# Patient Record
Sex: Female | Born: 1965
Health system: Southern US, Community
[De-identification: ages and names within clinical notes are randomized; demographics above are authoritative.]

## PROBLEM LIST (undated history)

## (undated) DIAGNOSIS — K219 Gastro-esophageal reflux disease without esophagitis: Secondary | ICD-10-CM

## (undated) DIAGNOSIS — I10 Essential (primary) hypertension: Secondary | ICD-10-CM

## (undated) DIAGNOSIS — R300 Dysuria: Secondary | ICD-10-CM

## (undated) DIAGNOSIS — M79604 Pain in right leg: Secondary | ICD-10-CM

## (undated) HISTORY — PX: HYSTEROSCOPY: SHX211

## (undated) HISTORY — DX: Dysuria: R30.0

## (undated) HISTORY — DX: Essential (primary) hypertension: I10

## (undated) HISTORY — DX: Pain in right leg: M79.604

## (undated) HISTORY — DX: Gastro-esophageal reflux disease without esophagitis: K21.9

---

## 2003-05-28 ENCOUNTER — Ambulatory Visit (HOSPITAL_COMMUNITY): Admission: RE | Admit: 2003-05-28 | Discharge: 2003-05-28 | Payer: Self-pay | Admitting: Gynecology

## 2003-05-28 ENCOUNTER — Encounter: Payer: Self-pay | Admitting: Gynecology

## 2003-07-27 ENCOUNTER — Observation Stay (HOSPITAL_COMMUNITY): Admission: EM | Admit: 2003-07-27 | Discharge: 2003-07-28 | Payer: Self-pay | Admitting: Gynecology

## 2003-07-27 ENCOUNTER — Ambulatory Visit (HOSPITAL_BASED_OUTPATIENT_CLINIC_OR_DEPARTMENT_OTHER): Admission: RE | Admit: 2003-07-27 | Discharge: 2003-07-27 | Payer: Self-pay | Admitting: Gynecology

## 2013-02-18 ENCOUNTER — Emergency Department (HOSPITAL_BASED_OUTPATIENT_CLINIC_OR_DEPARTMENT_OTHER)
Admission: EM | Admit: 2013-02-18 | Discharge: 2013-02-19 | Disposition: A | Discharge status: Home / Self Care | Payer: Self-pay | Attending: Emergency Medicine | Admitting: Emergency Medicine

## 2013-02-18 ENCOUNTER — Encounter (HOSPITAL_BASED_OUTPATIENT_CLINIC_OR_DEPARTMENT_OTHER): Payer: Self-pay | Admitting: *Deleted

## 2013-02-18 DIAGNOSIS — I10 Essential (primary) hypertension: Secondary | ICD-10-CM | POA: Insufficient documentation

## 2013-02-18 DIAGNOSIS — Y92009 Unspecified place in unspecified non-institutional (private) residence as the place of occurrence of the external cause: Secondary | ICD-10-CM | POA: Insufficient documentation

## 2013-02-18 DIAGNOSIS — Y9389 Activity, other specified: Secondary | ICD-10-CM | POA: Insufficient documentation

## 2013-02-18 DIAGNOSIS — F17200 Nicotine dependence, unspecified, uncomplicated: Secondary | ICD-10-CM | POA: Insufficient documentation

## 2013-02-18 DIAGNOSIS — S61409A Unspecified open wound of unspecified hand, initial encounter: Secondary | ICD-10-CM | POA: Insufficient documentation

## 2013-02-18 DIAGNOSIS — Z23 Encounter for immunization: Secondary | ICD-10-CM | POA: Insufficient documentation

## 2013-02-18 DIAGNOSIS — S61411A Laceration without foreign body of right hand, initial encounter: Secondary | ICD-10-CM

## 2013-02-18 DIAGNOSIS — W268XXA Contact with other sharp object(s), not elsewhere classified, initial encounter: Secondary | ICD-10-CM | POA: Insufficient documentation

## 2013-02-18 DIAGNOSIS — Z880 Allergy status to penicillin: Secondary | ICD-10-CM | POA: Insufficient documentation

## 2013-02-18 DIAGNOSIS — Z79899 Other long term (current) drug therapy: Secondary | ICD-10-CM | POA: Insufficient documentation

## 2013-02-18 HISTORY — DX: Essential (primary) hypertension: I10

## 2013-02-18 NOTE — ED Notes (Signed)
C/o lac to right hand from broken glass, bleeding controlled at this time. Tetanus UTD.

## 2013-02-18 NOTE — ED Provider Notes (Signed)
History    CSN: [id number] Arrival date & time 02/18/13  2349  None    Chief Complaint  Patient presents with  . Laceration   (Consider location/radiation/quality/duration/timing/severity/associated sxs/prior Treatment) HPI patient lacerated her right hand on broken glass while washing dishes at home immediately prior to arrival. No other injury. She has a laceration and right hand. She treated herself by pouring alcohol over the wound immediately. She complains of pain and laceration and right hand located at MCP joint of the index finger, dorsal radial aspect. No other associated symptoms. Pain is mild to moderate at present. Nonradiating. No numbness. Last tetanus immunization 2000 Past Medical History  Diagnosis Date  . Hypertension    History reviewed. No pertinent past surgical history. No family history on file. History  Substance Use Topics  . Smoking status: Current Every Day Smoker  . Smokeless tobacco: Not on file  . Alcohol Use: No   OB History   Grav Para Term Preterm Abortions TAB SAB Ect Mult Living                 Review of Systems  Constitutional: Negative.   Skin: Positive for wound.  Hematological: Negative.     Allergies  Penicillins  Home Medications   Current Outpatient Rx  Name  Route  Sig  Dispense  Refill  . amLODipine (NORVASC) 10 MG tablet   Oral   Take 10 mg by mouth daily.         Marland Kitchen lisinopril-hydrochlorothiazide (PRINZIDE,ZESTORETIC) 20-25 MG per tablet   Oral   Take 1 tablet by mouth daily.          BP 132/85  Pulse 101  Temp(Src) 99.1 F (37.3 C) (Oral)  Resp 20  Ht 5\' 5"  (1.651 m)  Wt 174 lb (78.926 kg)  BMI 28.96 kg/m2  SpO2 96% Physical Exam  Nursing note and vitals reviewed. Constitutional: She appears well-developed. No distress.  HENT:  Head: Normocephalic and atraumatic.  Eyes: EOM are normal.  Neck: Neck supple.  Pulmonary/Chest: Effort normal.  Abdominal: Bowel sounds are normal. She exhibits no  distension.  Musculoskeletal: Normal range of motion. She exhibits no edema and no tenderness.  Right hand there is a 2 cm V-shaped laceration over the dorsal radial aspect of the MCP joint of the index finger. It does appear together finger has full range of motion. This did not come apart with full active or passive range of motion of the finger. No swelling. Good capillary refill in all digits. The extremity is otherwise atraumatic. All other extremities atraumatic, neurovascularly intact  Neurological: She is alert. Coordination normal.  Skin: Skin is warm and dry. No rash noted.  Psychiatric: She has a normal mood and affect.    ED Course  Procedures (including critical care time) Labs Reviewed - No data to display No results found. No diagnosis found. Xray viewed by me No results found for this or any previous visit. Dg Hand Complete Right  02/19/2013   *RADIOLOGY REPORT*  Clinical Data: Right hand laceration.  Index finger metacarpal head laceration.  Pain and swelling.  RIGHT HAND - COMPLETE 3+ VIEW  Comparison: None.  Findings: No radiopaque foreign body.  No fracture.  Anatomic alignment.  Soft tissues appear within normal limits.  IMPRESSION: Negative.   Original Report Authenticated By: Andreas Newport, M.D.    MDM  Patient washed hand with copious tap water. Lacerations did not require repair. Plan local wound care. Diagnosis laceration right hand  Doug Sou, MD 02/19/13 [phone]

## 2013-02-19 ENCOUNTER — Emergency Department (HOSPITAL_BASED_OUTPATIENT_CLINIC_OR_DEPARTMENT_OTHER): Payer: Self-pay

## 2013-02-19 MED ORDER — TETANUS-DIPHTH-ACELL PERTUSSIS 5-2.5-18.5 LF-MCG/0.5 IM SUSP
0.5000 mL | Freq: Once | INTRAMUSCULAR | Status: AC
  Administered 2013-02-19: 0.5 mL via INTRAMUSCULAR
  Filled 2013-02-19: qty 0.5

## 2013-02-19 MED ORDER — BACITRACIN 500 UNIT/GM EX OINT
1.0000 "application " | TOPICAL_OINTMENT | Freq: Two times a day (BID) | CUTANEOUS | Status: DC
  Administered 2013-02-19: 1 via TOPICAL
  Filled 2013-02-19: qty 0.9

## 2013-02-19 NOTE — ED Notes (Signed)
Patient transported to X-ray 

## 2014-07-21 IMAGING — CR DG HAND COMPLETE 3+V*R*
3 series · 3 of 3 positions shown · non-contrast
Comparison: None.

CLINICAL DATA: Right hand laceration.  Index finger metacarpal head
laceration.  Pain and swelling.

RIGHT HAND - COMPLETE 3+ VIEW

[x hand pa right]
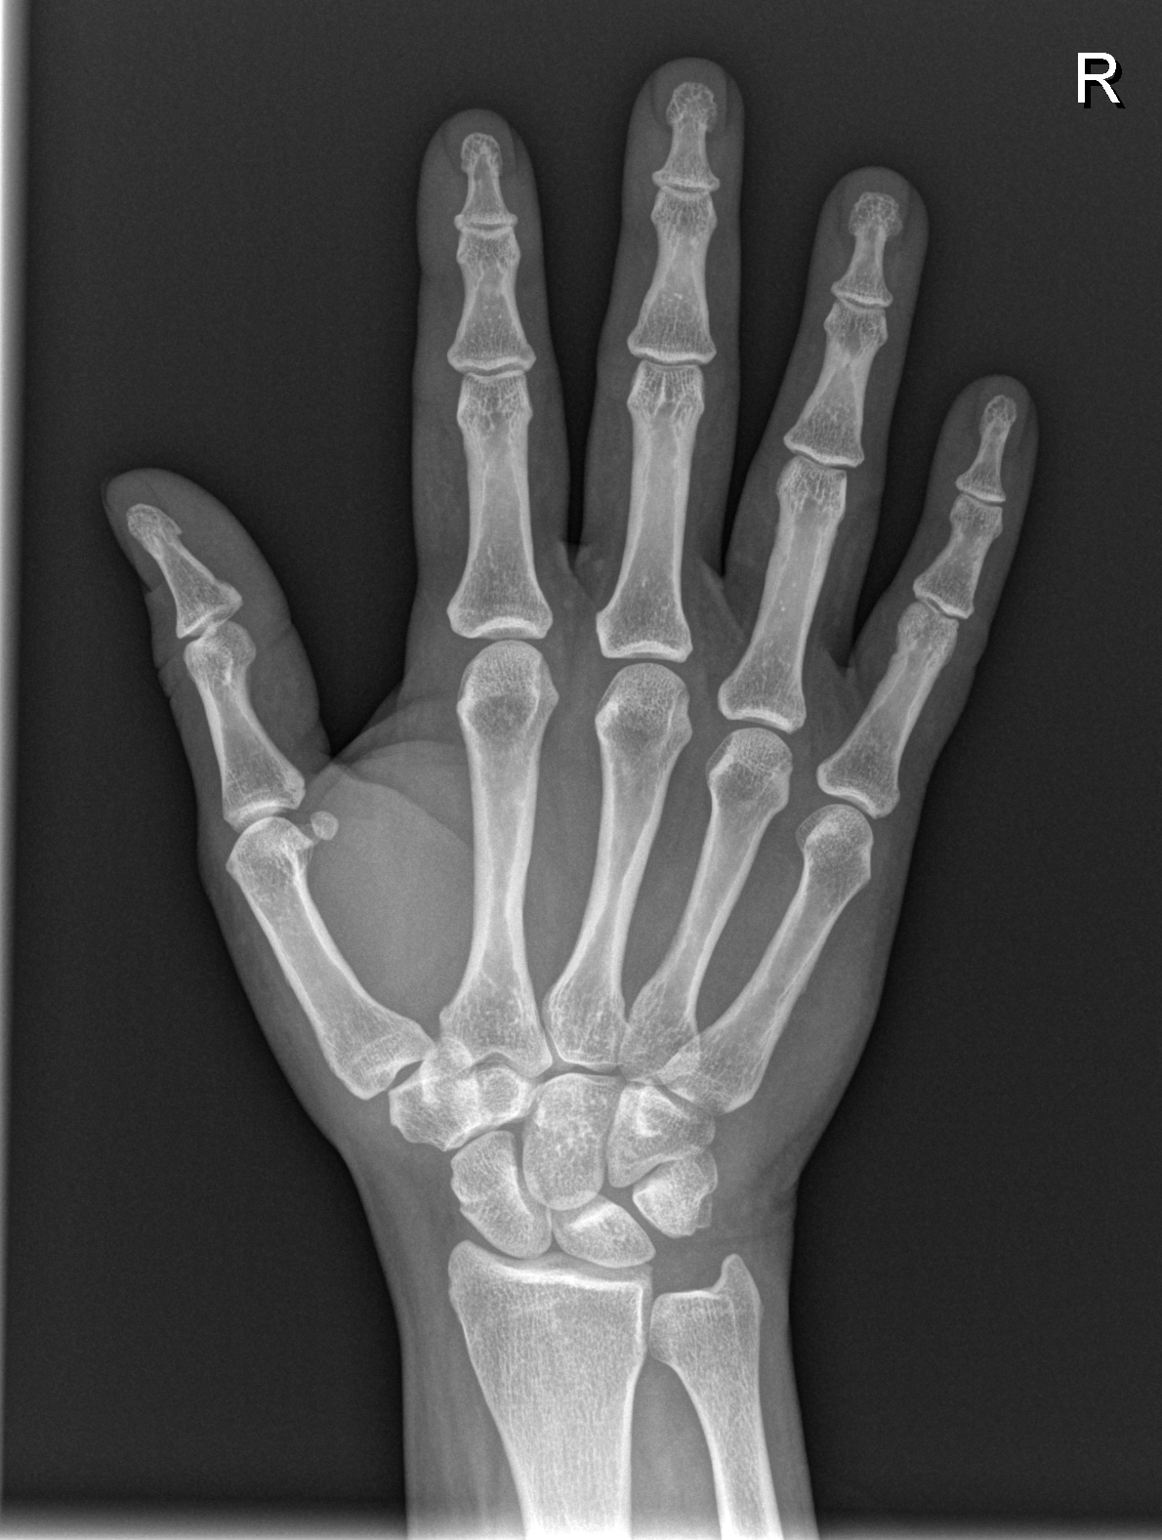

[x hand oblique right]
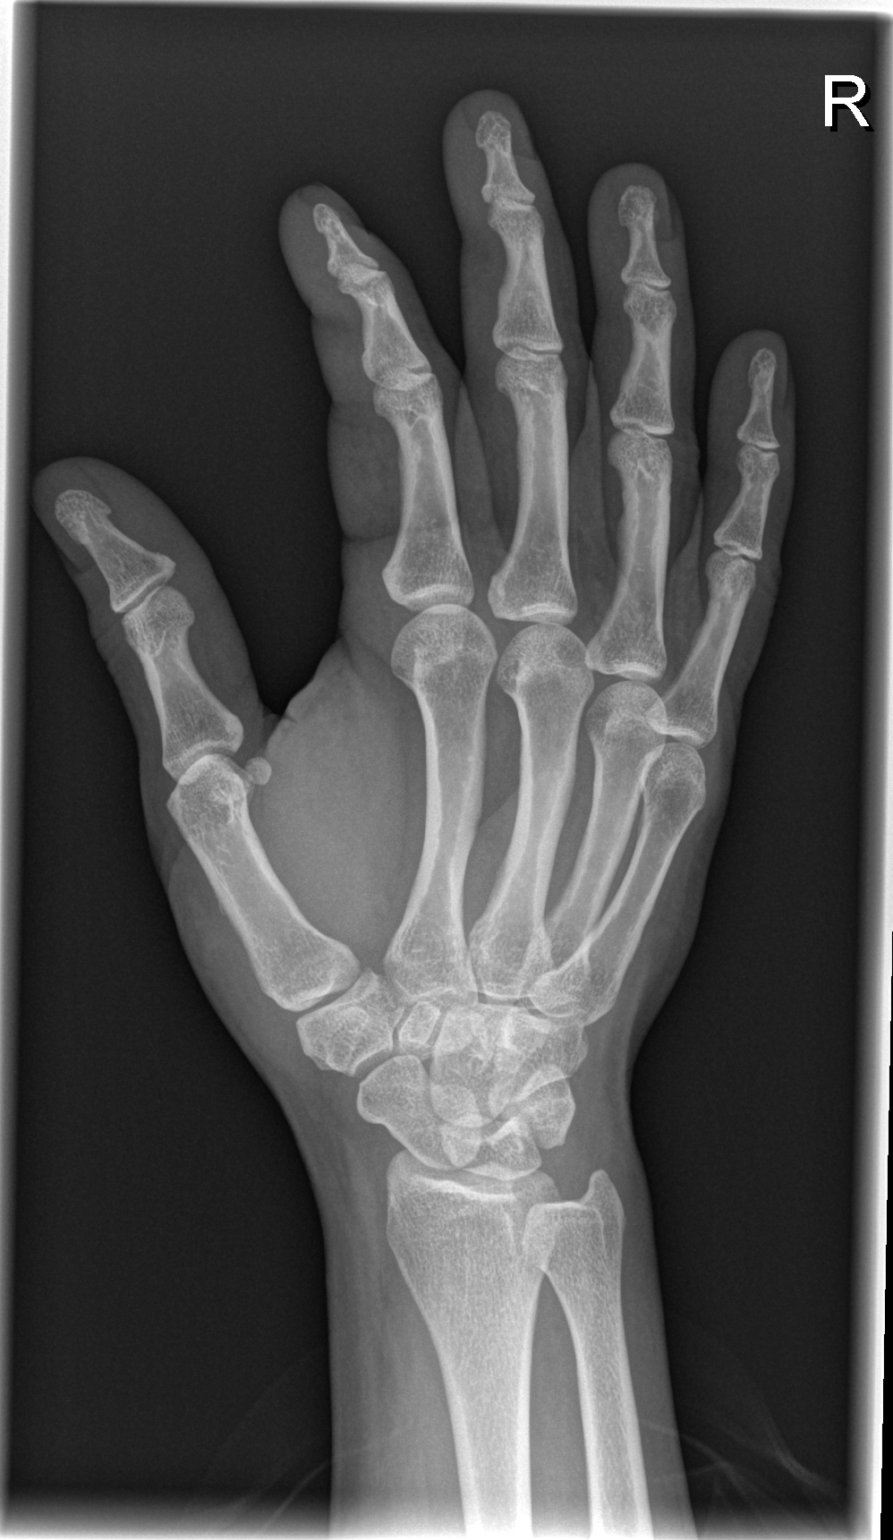

[x hand lat right]
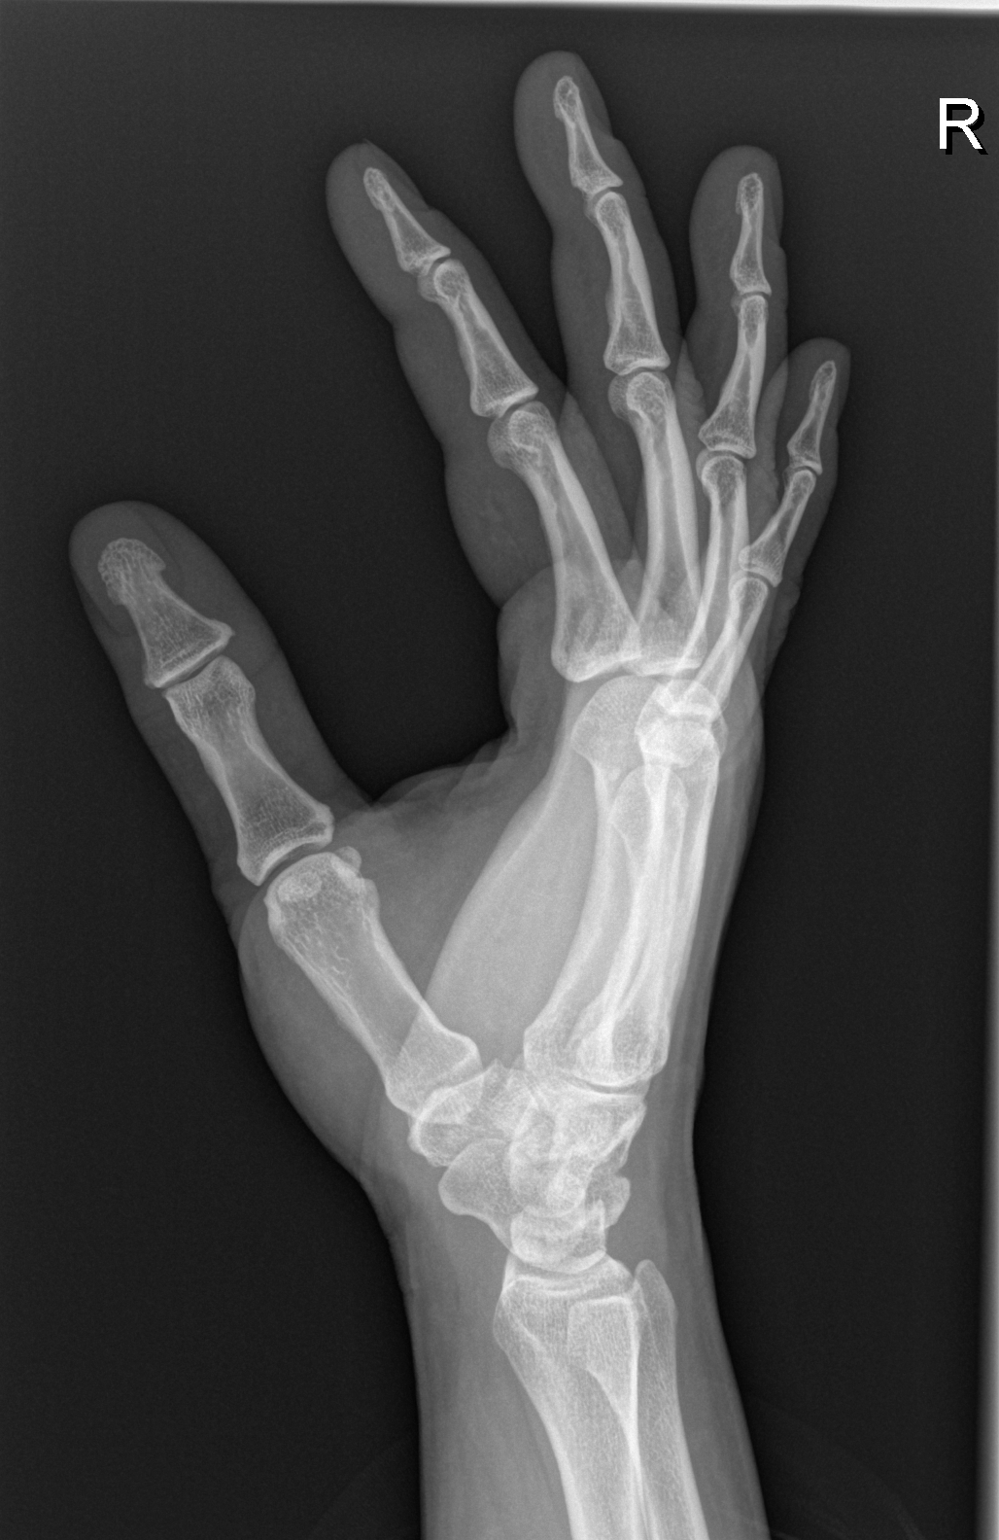

[3 of 3 positions shown; findings below may reference images not displayed]

FINDINGS: No radiopaque foreign body.  No fracture.  Anatomic
alignment.  Soft tissues appear within normal limits.
IMPRESSION: Negative.

## 2014-10-20 LAB — HM MAMMOGRAPHY: HM Mammogram: NORMAL (ref 0–4)

## 2015-01-20 LAB — HM PAP SMEAR: HM Pap smear: NORMAL

## 2016-02-10 ENCOUNTER — Telehealth: Payer: Self-pay | Admitting: Behavioral Health

## 2016-02-10 ENCOUNTER — Encounter: Payer: Self-pay | Admitting: Behavioral Health

## 2016-02-10 NOTE — Telephone Encounter (Signed)
Pre-Visit Call completed with patient and chart updated.   Pre-Visit Info documented in Specialty Comments under [employer].    

## 2016-02-11 ENCOUNTER — Ambulatory Visit (INDEPENDENT_AMBULATORY_CARE_PROVIDER_SITE_OTHER): Payer: BLUE CROSS/BLUE SHIELD | Admitting: Family Medicine

## 2016-02-11 ENCOUNTER — Encounter: Payer: Self-pay | Admitting: Family Medicine

## 2016-02-11 VITALS — BP 102/66 | HR 82 | Temp 98.0°F | Ht 61.0 in | Wt 159.5 lb

## 2016-02-11 DIAGNOSIS — I10 Essential (primary) hypertension: Secondary | ICD-10-CM

## 2016-02-11 DIAGNOSIS — M79604 Pain in right leg: Secondary | ICD-10-CM | POA: Diagnosis not present

## 2016-02-11 DIAGNOSIS — L237 Allergic contact dermatitis due to plants, except food: Secondary | ICD-10-CM | POA: Diagnosis not present

## 2016-02-11 DIAGNOSIS — R300 Dysuria: Secondary | ICD-10-CM | POA: Diagnosis not present

## 2016-02-11 DIAGNOSIS — K219 Gastro-esophageal reflux disease without esophagitis: Secondary | ICD-10-CM

## 2016-02-11 HISTORY — DX: Pain in right leg: M79.604

## 2016-02-11 HISTORY — DX: Dysuria: R30.0

## 2016-02-11 HISTORY — DX: Essential (primary) hypertension: I10

## 2016-02-11 MED ORDER — TRIAMCINOLONE ACETONIDE 0.1 % EX CREA: 1.0000 "application " | TOPICAL_CREAM | Freq: Two times a day (BID) | CUTANEOUS | Status: DC

## 2016-02-11 MED ORDER — AMLODIPINE BESYLATE 10 MG PO TABS: 10.0000 mg | ORAL_TABLET | Freq: Every day | ORAL | Status: DC

## 2016-02-11 MED ORDER — LISINOPRIL-HYDROCHLOROTHIAZIDE 20-25 MG PO TABS: 1.0000 | ORAL_TABLET | Freq: Every day | ORAL | Status: DC

## 2016-02-11 NOTE — Patient Instructions (Addendum)
Leg pain stretch daily, and Salon pas gel to painful areas twice daily  Poison ivy wash with Dawn Dishwashing liquid with cool water then clean area with Berkshire HathawayWitch Hazel Astringent then apply the Triamcinolone steroid cream if any lesions develop Shoe inserts, Dr Margart SicklesScholl's machine    [facility]  Hypertension Hypertension, commonly called high blood pressure, is when the force of blood pumping through your arteries is too strong. Your arteries are the blood vessels that carry blood from your heart throughout your body. A blood pressure reading consists of a higher number over a lower number, such as 110/72. The higher number (systolic) is the pressure inside your arteries when your heart pumps. The lower number (diastolic) is the pressure inside your arteries when your heart relaxes. Ideally you want your blood pressure below 120/80. Hypertension forces your heart to work harder to pump blood. Your arteries may become narrow or stiff. Having untreated or uncontrolled hypertension can cause heart attack, stroke, kidney disease, and other problems. RISK FACTORS Some risk factors for high blood pressure are controllable. Others are not.  Risk factors you cannot control include:   Race. You may be at higher risk if you are African American.  Age. Risk increases with age.  Gender. Men are at higher risk than women before age 52 years. After age 52, women are at higher risk than men. Risk factors you can control include:  Not getting enough exercise or physical activity.  Being overweight.  Getting too much fat, sugar, calories, or salt in your diet.  Drinking too much alcohol. SIGNS AND SYMPTOMS Hypertension does not usually cause signs or symptoms. Extremely high blood pressure (hypertensive crisis) may cause headache, anxiety, shortness of breath, and nosebleed. DIAGNOSIS To check if you have hypertension, your health care provider will measure your blood pressure while you are seated, with  your arm held at the level of your heart. It should be measured at least twice using the same arm. Certain conditions can cause a difference in blood pressure between your right and left arms. A blood pressure reading that is higher than normal on one occasion does not mean that you need treatment. If it is not clear whether you have high blood pressure, you may be asked to return on a different day to have your blood pressure checked again. Or, you may be asked to monitor your blood pressure at home for 1 or more weeks. TREATMENT Treating high blood pressure includes making lifestyle changes and possibly taking medicine. Living a healthy lifestyle can help lower high blood pressure. You may need to change some of your habits. Lifestyle changes may include:  Following the DASH diet. This diet is high in fruits, vegetables, and whole grains. It is low in salt, red meat, and added sugars.  Keep your sodium intake below 2,300 mg per day.  Getting at least 30-45 minutes of aerobic exercise at least 4 times per week.  Losing weight if necessary.  Not smoking.  Limiting alcoholic beverages.  Learning ways to reduce stress. Your health care provider may prescribe medicine if lifestyle changes are not enough to get your blood pressure under control, and if one of the following is true:  You are 4818-52 years of age and your systolic blood pressure is above 140.  You are 52 years of age or older, and your systolic blood pressure is above 150.  Your diastolic blood pressure is above 90.  You have diabetes, and your systolic blood pressure is over 140 or  your diastolic blood pressure is over 90.  You have kidney disease and your blood pressure is above 140/90.  You have heart disease and your blood pressure is above 140/90. Your personal target blood pressure may vary depending on your medical conditions, your age, and other factors. HOME CARE INSTRUCTIONS  Have your blood pressure rechecked as  directed by your health care provider.   Take medicines only as directed by your health care provider. Follow the directions carefully. Blood pressure medicines must be taken as prescribed. The medicine does not work as well when you skip doses. Skipping doses also puts you at risk for problems.  Do not smoke.   Monitor your blood pressure at home as directed by your health care provider. SEEK MEDICAL CARE IF:   You think you are having a reaction to medicines taken.  You have recurrent headaches or feel dizzy.  You have swelling in your ankles.  You have trouble with your vision. SEEK IMMEDIATE MEDICAL CARE IF:  You develop a severe headache or confusion.  You have unusual weakness, numbness, or feel faint.  You have severe chest or abdominal pain.  You vomit repeatedly.  You have trouble breathing. MAKE SURE YOU:   Understand these instructions.  Will watch your condition.  Will get help right away if you are not doing well or get worse.   This information is not intended to replace advice given to you by your health care provider. Make sure you discuss any questions you have with your health care provider.   Document Released: 08/07/2005 Document Revised: 12/22/2014 Document Reviewed: 05/30/2013 Elsevier Interactive Patient Education [employer].

## 2016-02-11 NOTE — Progress Notes (Signed)
Pre visit review using our clinic review tool, if applicable. No additional management support is needed unless otherwise documented below in the visit note. 

## 2016-02-20 ENCOUNTER — Encounter: Payer: Self-pay | Admitting: Family Medicine

## 2016-02-20 DIAGNOSIS — L237 Allergic contact dermatitis due to plants, except food: Secondary | ICD-10-CM | POA: Insufficient documentation

## 2016-02-20 DIAGNOSIS — K219 Gastro-esophageal reflux disease without esophagitis: Secondary | ICD-10-CM

## 2016-02-20 HISTORY — DX: Gastro-esophageal reflux disease without esophagitis: K21.9

## 2016-02-20 NOTE — Assessment & Plan Note (Signed)
Dawn dishsoap, witch hazel astringent and given rx for Triamcinolone cream prn.

## 2016-02-20 NOTE — Progress Notes (Signed)
Patient ID: Patricia Kennedy, female   DOB: [birth year], 50 y.o.   MRN: [id number]   Subjective:    Patient ID: Patricia Kennedy, female    DOB: May 19, 1966, 50 y.o.   MRN: [id number]  Chief Complaint  Patient presents with  . Establish Care    HPI Patient is in today for new patient appointment. Has a past medical history of hypertension and heartburn. Has intermittent trouble with poison ivy after working outside. Has had some intermittent heartburn lately. Is having trouble with right leg pain off and on for years. No recent fall or flare. Denies CP/palp/SOB/HA/congestion/fevers/GI or GU c/o. Taking meds as prescribed  Past Medical History  Diagnosis Date  . Hypertension   . Essential hypertension 02/11/2016  . Leg pain, right 02/11/2016  . Dysuria 02/11/2016  . GERD (gastroesophageal reflux disease) 02/20/2016    Past Surgical History  Procedure Laterality Date  . Hysteroscopy      and D and C for incomplete miscarriage    Family History  Problem Relation Age of Onset  . Heart disease Mother   . Hypertension Mother   . Stroke Mother   . Heart disease Father   . Hypertension Father   . Heart disease Sister     palpitations  . Heart disease Brother   . Heart disease Daughter     MI  . Jaundice Maternal Grandmother     liver disease  . GER disease Sister   . Heart disease Brother     Social History   Social History  . Marital Status: Married    Spouse Name: N/A  . Number of Children: N/A  . Years of Education: N/A   Occupational History  . Not on file.   Social History Main Topics  . Smoking status: Current Every Day Smoker  . Smokeless tobacco: Not on file  . Alcohol Use: No  . Drug Use: No  . Sexual Activity: Not on file   Other Topics Concern  . 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    Following Ramadan at present   Wears seat belt    Outpatient Prescriptions  Prior to Visit  Medication Sig Dispense Refill  . amLODipine (NORVASC) 10 MG tablet Take 10 mg by mouth daily.    Marland Kitchen lisinopril-hydrochlorothiazide (PRINZIDE,ZESTORETIC) 20-25 MG per tablet Take 1 tablet by mouth daily.     No facility-administered medications prior to visit.    Allergies  Allergen Reactions  . Penicillins     Review of Systems  Constitutional: Negative for fever, chills and malaise/fatigue.  HENT: Negative for congestion and hearing loss.   Eyes: Negative for discharge.  Respiratory: Negative for cough, sputum production and shortness of breath.   Cardiovascular: Negative for chest pain, palpitations and leg swelling.  Gastrointestinal: Positive for heartburn. Negative for nausea, vomiting, abdominal pain, diarrhea, constipation and blood in stool.  Genitourinary: Positive for dysuria. Negative for urgency, frequency and hematuria.  Musculoskeletal: Positive for joint pain. Negative for myalgias, back pain and falls.  Skin: Negative for rash.  Neurological: Negative for dizziness, sensory change, loss of consciousness, weakness and headaches.  Endo/Heme/Allergies: Negative for environmental allergies. Does not bruise/bleed easily.  Psychiatric/Behavioral: Negative for depression and suicidal ideas. The patient is not nervous/anxious and does not have insomnia.        Objective:    Physical Exam  Constitutional: She is oriented to person, place, and  time. She appears well-developed and well-nourished. No distress.  HENT:  Head: Normocephalic and atraumatic.  Eyes: Conjunctivae are normal.  Neck: Neck supple. No thyromegaly present.  Cardiovascular: Normal rate, regular rhythm and normal heart sounds.   No murmur heard. Pulmonary/Chest: Effort normal and breath sounds normal. No respiratory distress.  Abdominal: Soft. Bowel sounds are normal. She exhibits no distension and no mass. There is no tenderness.  Musculoskeletal: She exhibits no edema.    Lymphadenopathy:    She has no cervical adenopathy.  Neurological: She is alert and oriented to person, place, and time.  Skin: Skin is warm and dry.  Psychiatric: She has a normal mood and affect. Her behavior is normal.    BP 102/66 mmHg  Pulse 82  Temp(Src) 98 F (36.7 C) (Oral)  Ht '5\' 1"'  (1.549 m)  Wt 159 lb 8 oz (72.349 kg)  BMI 30.15 kg/m2  SpO2 95% Wt Readings from Last 3 Encounters:  02/11/16 159 lb 8 oz (72.349 kg)  02/18/13 174 lb (78.926 kg)     No results found for: WBC, HGB, HCT, PLT, GLUCOSE, CHOL, TRIG, HDL, LDLDIRECT, LDLCALC, ALT, AST, NA, K, CL, CREATININE, BUN, CO2, TSH, PSA, INR, GLUF, HGBA1C, MICROALBUR  No results found for: TSH No results found for: WBC, HGB, HCT, MCV, PLT No results found for: NA, K, CHLORIDE, CO2, GLUCOSE, BUN, CREATININE, BILITOT, ALKPHOS, AST, ALT, PROT, ALBUMIN, CALCIUM, ANIONGAP, EGFR, GFR No results found for: CHOL No results found for: HDL No results found for: LDLCALC No results found for: TRIG No results found for: CHOLHDL No results found for: HGBA1C     Assessment & Plan:   Problem List Items Addressed This Visit    Essential hypertension - Primary    Well controlled, no changes to meds. Encouraged heart healthy diet such as the DASH diet and exercise as tolerated.       Relevant Medications   lisinopril-hydrochlorothiazide (PRINZIDE,ZESTORETIC) 20-25 MG tablet   amLODipine (NORVASC) 10 MG tablet   Leg pain, right    May use Tylenol prn, topical treatments and stay as active as possible.      Dysuria    Urine studies studies ordered but not completed., discussed need for probiotics and increased hydraiton      Relevant Orders   Urinalysis   Urine culture   Poison ivy    Dawn dishsoap, witch hazel astringent and given rx for Triamcinolone cream prn.      Relevant Medications   triamcinolone cream (KENALOG) 0.1 %   GERD (gastroesophageal reflux disease)    Avoid offending foods, take probiotics. Do not  eat large meals in late evening and consider raising head of bed.          I have changed Patricia Kennedy lisinopril-hydrochlorothiazide and amLODipine. I am also having her start on triamcinolone cream.  Meds ordered this encounter  Medications  . triamcinolone cream (KENALOG) 0.1 %    Sig: Apply 1 application topically 2 (two) times daily.    Dispense:  45 g    Refill:  2  . lisinopril-hydrochlorothiazide (PRINZIDE,ZESTORETIC) 20-25 MG tablet    Sig: Take 1 tablet by mouth daily.    Dispense:  30 tablet    Refill:  11  . amLODipine (NORVASC) 10 MG tablet    Sig: Take 1 tablet (10 mg total) by mouth daily.    Dispense:  30 tablet    Refill:  11     Penni Homans, MD

## 2016-02-20 NOTE — Assessment & Plan Note (Signed)
Avoid offending foods, take probiotics. Do not eat large meals in late evening and consider raising head of bed.  

## 2016-02-20 NOTE — Assessment & Plan Note (Signed)
Well controlled, no changes to meds. Encouraged heart healthy diet such as the DASH diet and exercise as tolerated.  °

## 2016-02-20 NOTE — Assessment & Plan Note (Signed)
Urine studies studies ordered but not completed., discussed need for probiotics and increased hydraiton

## 2016-02-20 NOTE — Assessment & Plan Note (Signed)
May use Tylenol prn, topical treatments and stay as active as possible.

## 2016-03-08 ENCOUNTER — Telehealth: Payer: Self-pay | Admitting: *Deleted

## 2016-03-08 NOTE — Telephone Encounter (Signed)
Forwarded to Dr. Blyth 

## 2016-05-12 ENCOUNTER — Ambulatory Visit: Payer: BLUE CROSS/BLUE SHIELD | Admitting: Family Medicine

## 2017-06-16 ENCOUNTER — Encounter (HOSPITAL_BASED_OUTPATIENT_CLINIC_OR_DEPARTMENT_OTHER): Payer: Self-pay | Admitting: Emergency Medicine

## 2017-06-16 ENCOUNTER — Emergency Department (HOSPITAL_BASED_OUTPATIENT_CLINIC_OR_DEPARTMENT_OTHER)
Admission: EM | Admit: 2017-06-16 | Discharge: 2017-06-16 | Disposition: A | Discharge status: Home / Self Care | Payer: BLUE CROSS/BLUE SHIELD | Attending: Emergency Medicine | Admitting: Emergency Medicine

## 2017-06-16 DIAGNOSIS — I10 Essential (primary) hypertension: Secondary | ICD-10-CM | POA: Diagnosis not present

## 2017-06-16 DIAGNOSIS — H0014 Chalazion left upper eyelid: Secondary | ICD-10-CM | POA: Insufficient documentation

## 2017-06-16 DIAGNOSIS — K047 Periapical abscess without sinus: Secondary | ICD-10-CM | POA: Diagnosis not present

## 2017-06-16 DIAGNOSIS — Z79899 Other long term (current) drug therapy: Secondary | ICD-10-CM | POA: Insufficient documentation

## 2017-06-16 DIAGNOSIS — K0889 Other specified disorders of teeth and supporting structures: Secondary | ICD-10-CM | POA: Diagnosis present

## 2017-06-16 MED ORDER — NAPROXEN 500 MG PO TABS: 500.0000 mg | ORAL_TABLET | Freq: Two times a day (BID) | ORAL | 0 refills | Status: DC

## 2017-06-16 MED ORDER — BENZOCAINE 10 % MT GEL: 1.0000 "application " | OROMUCOSAL | 0 refills | Status: DC | PRN

## 2017-06-16 MED ORDER — CLINDAMYCIN HCL 150 MG PO CAPS: 300.0000 mg | ORAL_CAPSULE | Freq: Three times a day (TID) | ORAL | 0 refills | Status: AC

## 2017-06-16 MED ORDER — LIDOCAINE-EPINEPHRINE (PF) 2 %-1:200000 IJ SOLN
10.0000 mL | Freq: Once | INTRAMUSCULAR | Status: AC
  Administered 2017-06-16: 10 mL
  Filled 2017-06-16: qty 10

## 2017-06-16 NOTE — ED Notes (Signed)
ED Provider at bedside. 

## 2017-06-16 NOTE — ED Provider Notes (Signed)
[facility] EMERGENCY DEPARTMENT Provider Note   CSN: [id number] Arrival date & time: 06/16/17  1305     History   Chief Complaint Chief Complaint  Patient presents with  . Dental Pain    HPI Patricia Kennedy is a 51 y.o. female presenting with left lower tooth pain x1 day.   She states that for the past day, she has had worsening left lower tooth pain.  She reports intermittent pain in this area, but has not seen a dentist.  She states that she does not have dental insurance.  She reports a throbbing pain.  She denies fevers, chills, difficulty swallowing, difficulty breathing, nausea, or vomiting.  She took Tylenol yesterday for headache, but this did not improve her tooth pain.  She has not tried anything else for pain. Additionally, patient reports lesion on left upper eyelid x2 days.  She is wondering if there is a cream to treat this.  She denies significant pain with this lesion.  She denies history of similar.  She denies vision changes, eye pain, or pain with movement of her eyes.   HPI  Past Medical History:  Diagnosis Date  . Dysuria 02/11/2016  . Essential hypertension 02/11/2016  . GERD (gastroesophageal reflux disease) 02/20/2016  . Hypertension   . Leg pain, right 02/11/2016    Patient Active Problem List   Diagnosis Date Noted  . Poison ivy 02/20/2016  . GERD (gastroesophageal reflux disease) 02/20/2016  . Essential hypertension 02/11/2016  . Leg pain, right 02/11/2016  . Dysuria 02/11/2016    Past Surgical History:  Procedure Laterality Date  . HYSTEROSCOPY     and D and C for incomplete miscarriage    OB History    No data available       Home Medications    Prior to Admission medications   Medication Sig Start Date End Date Taking? Authorizing Provider  amLODipine (NORVASC) 10 MG tablet Take 1 tablet (10 mg total) by mouth daily. 02/11/16  Yes Bradd Canary, MD  lisinopril-hydrochlorothiazide (PRINZIDE,ZESTORETIC) 20-25 MG tablet Take  1 tablet by mouth daily. 02/11/16  Yes Bradd Canary, MD  benzocaine (ORAJEL) 10 % mucosal gel Use as directed 1 application in the mouth or throat as needed for mouth pain. 06/16/17   Marvie Brevik, PA-C  clindamycin (CLEOCIN) 150 MG capsule Take 2 capsules (300 mg total) by mouth 3 (three) times daily. 06/16/17 06/23/17  Diamante Rubin, PA-C  naproxen (NAPROSYN) 500 MG tablet Take 1 tablet (500 mg total) by mouth 2 (two) times daily with a meal. 06/16/17   Matther Labell, PA-C  triamcinolone cream (KENALOG) 0.1 % Apply 1 application topically 2 (two) times daily. 02/11/16   Bradd Canary, MD    Family History Family History  Problem Relation Age of Onset  . Heart disease Mother   . Hypertension Mother   . Stroke Mother   . Heart disease Father   . Hypertension Father   . Heart disease Sister        palpitations  . Heart disease Brother   . Jaundice Maternal Grandmother        liver disease  . GER disease Sister   . Heart disease Brother   . Heart disease Daughter        MI    Social History Social History  Substance Use Topics  . Smoking status: Current Every Day Smoker  . Smokeless tobacco: Never Used  . Alcohol use No     Allergies  Penicillins   Review of Systems Review of Systems  Constitutional: Negative for chills and fever.  HENT: Positive for dental problem. Negative for trouble swallowing.   Eyes:       Lesion on left eyelid     Physical Exam Updated Vital Signs BP (!) 135/105 (BP Location: Left Wrist)   Pulse 91   Temp 98 F (36.7 C) (Oral)   Resp 18   Wt 70.3 kg (155 lb)   SpO2 97%   BMI 29.29 kg/m   Physical Exam  Constitutional: She is oriented to person, place, and time. She appears well-developed and well-nourished. No distress.  HENT:  Head: Normocephalic and atraumatic.  Mouth/Throat: Uvula is midline, oropharynx is clear and moist and mucous membranes are normal. No trismus in the jaw. Abnormal dentition. Dental abscesses  and dental caries present.    Multiple dental caries.  Palpable abscess of gun surrounding left lower tooth.  No trismus.  Uvula midline with equal palate rise.  Handling secretions easily.  No pain under the tongue.  Eyes: Pupils are equal, round, and reactive to light. Conjunctivae and EOM are normal. Left eye exhibits no discharge.    Neck: Normal range of motion. Neck supple.  Pulmonary/Chest: Effort normal.  Abdominal: She exhibits no distension.  Musculoskeletal: Normal range of motion.  Neurological: She is alert and oriented to person, place, and time.  Skin: Skin is warm. No rash noted.  Psychiatric: She has a normal mood and affect.  Nursing note and vitals reviewed.    ED Treatments / Results  Labs (all labs ordered are listed, but only abnormal results are displayed) Labs Reviewed - No data to display  EKG  EKG Interpretation None       Radiology No results found.  Procedures .Marland Kitchen.Incision and Drainage Date/Time: 06/16/2017 1:58 PM Performed by: Alveria ApleyACCAVALE, Nyal Schachter Authorized by: Alveria ApleyACCAVALE, Aristotle Lieb   Consent:    Consent obtained:  Verbal   Consent given by:  Patient   Risks discussed:  Incomplete drainage, infection, bleeding and pain   Alternatives discussed:  No treatment Location:    Type:  Abscess   Location:  Mouth   Mouth location:  Alveolar process Anesthesia (see MAR for exact dosages):    Anesthesia method:  Local infiltration   Local anesthetic:  Lidocaine 2% w/o epi Procedure type:    Complexity:  Simple Procedure details:    Needle aspiration: no     Incision types:  Stab incision   Scalpel blade:  11   Drainage:  Purulent   Drainage amount:  Scant   Wound treatment:  Wound left open Post-procedure details:    Patient tolerance of procedure:  Tolerated well, no immediate complications    (including critical care time)  Medications Ordered in ED Medications  lidocaine-EPINEPHrine (XYLOCAINE W/EPI) 2 %-1:200000 (PF) injection 10 mL  (10 mLs Infiltration Given 06/16/17 1332)     Initial Impression / Assessment and Plan / ED Course  I have reviewed the triage vital signs and the nursing notes.  Pertinent labs & imaging results that were available during my care of the patient were reviewed by me and considered in my medical decision making (see chart for details).     Patient presenting with dental pain worsening yesterday.  Physical exam shows palpable abscess.  Performed I&D which expressed scant purulent drainage.  Will place patient on antibiotics, and given information for dentistry.  No trismus, difficulty handling secretions, pain under the tongue, swelling of the neck.  Doubt Ludwig's.  Will use ibuprofen for pain and swelling.  Orajel as needed for pain. Additionally, patient with chalazion of left upper eyelid.  Discussed using warm compress for symptoms.  Patient to follow-up with primary care in 1 week for evaluation of chalazion and dental pain. At this time, patient appears safe for discharge.  Return precautions given.  Patient states she understands and agrees to plan.  Final Clinical Impressions(s) / ED Diagnoses   Final diagnoses:  Dental abscess  Chalazion of left upper eyelid    New Prescriptions Discharge Medication List as of 06/16/2017  1:47 PM    START taking these medications   Details  benzocaine (ORAJEL) 10 % mucosal gel Use as directed 1 application in the mouth or throat as needed for mouth pain., Starting Sat 06/16/2017, Print    clindamycin (CLEOCIN) 150 MG capsule Take 2 capsules (300 mg total) by mouth 3 (three) times daily., Starting Sat 06/16/2017, Until Sat 06/23/2017, Print    naproxen (NAPROSYN) 500 MG tablet Take 1 tablet (500 mg total) by mouth 2 (two) times daily with a meal., Starting Sat 06/16/2017, Print         Camden, [place], PA-C 06/16/17 Suzan Garibaldi, MD 06/17/17 [phone]

## 2017-06-16 NOTE — Discharge Instructions (Signed)
Take antibiotics as prescribed.  Take the full week of antibiotics, even if you are feeling better. Take naproxen twice a day with meals.  Do not take other anti-inflammatories (Advil, Motrin, Aleve, ibuprofen).  You may take Tylenol if you need more pain control. You may use the orajel as needed for pain.  There is information about dentists in the area in the back of the paperwork.  Use a warm compress for the spot above your eye.  Follow up with your primary care doctor about your eye and tooth.  Return to the ER if you develop fevers, chills, difficulty swallowing, or any new or worsening symptoms.

## 2017-06-16 NOTE — ED Triage Notes (Signed)
Pt reports swelling and pain to L side of mouth since yesterday.

## 2017-11-29 DIAGNOSIS — M5442 Lumbago with sciatica, left side: Secondary | ICD-10-CM | POA: Insufficient documentation

## 2017-11-30 DIAGNOSIS — H524 Presbyopia: Secondary | ICD-10-CM | POA: Insufficient documentation

## 2017-11-30 DIAGNOSIS — H18519 Endothelial corneal dystrophy, unspecified eye: Secondary | ICD-10-CM | POA: Insufficient documentation

## 2017-11-30 DIAGNOSIS — H2513 Age-related nuclear cataract, bilateral: Secondary | ICD-10-CM | POA: Insufficient documentation

## 2017-11-30 DIAGNOSIS — H52223 Regular astigmatism, bilateral: Secondary | ICD-10-CM | POA: Insufficient documentation

## 2017-11-30 DIAGNOSIS — H18539 Granular corneal dystrophy, unspecified eye: Secondary | ICD-10-CM | POA: Insufficient documentation

## 2017-11-30 DIAGNOSIS — H5213 Myopia, bilateral: Secondary | ICD-10-CM | POA: Insufficient documentation

## 2018-04-27 DIAGNOSIS — J069 Acute upper respiratory infection, unspecified: Secondary | ICD-10-CM | POA: Insufficient documentation

## 2018-04-27 DIAGNOSIS — J209 Acute bronchitis, unspecified: Secondary | ICD-10-CM | POA: Insufficient documentation

## 2018-12-04 DIAGNOSIS — H1853 Granular corneal dystrophy: Secondary | ICD-10-CM | POA: Diagnosis not present

## 2018-12-04 DIAGNOSIS — D0339 Melanoma in situ of other parts of face: Secondary | ICD-10-CM | POA: Diagnosis not present

## 2018-12-04 DIAGNOSIS — H5213 Myopia, bilateral: Secondary | ICD-10-CM | POA: Diagnosis not present

## 2018-12-04 DIAGNOSIS — H1851 Endothelial corneal dystrophy: Secondary | ICD-10-CM | POA: Diagnosis not present

## 2018-12-13 DIAGNOSIS — L814 Other melanin hyperpigmentation: Secondary | ICD-10-CM | POA: Diagnosis not present

## 2018-12-13 DIAGNOSIS — D485 Neoplasm of uncertain behavior of skin: Secondary | ICD-10-CM | POA: Diagnosis not present

## 2018-12-13 DIAGNOSIS — L739 Follicular disorder, unspecified: Secondary | ICD-10-CM | POA: Diagnosis not present

## 2019-01-03 DIAGNOSIS — Z03818 Encounter for observation for suspected exposure to other biological agents ruled out: Secondary | ICD-10-CM | POA: Diagnosis not present

## 2019-01-20 DIAGNOSIS — K5904 Chronic idiopathic constipation: Secondary | ICD-10-CM | POA: Diagnosis not present

## 2019-01-20 DIAGNOSIS — M25511 Pain in right shoulder: Secondary | ICD-10-CM | POA: Diagnosis not present

## 2019-01-20 DIAGNOSIS — M79642 Pain in left hand: Secondary | ICD-10-CM | POA: Diagnosis not present

## 2019-01-20 DIAGNOSIS — R1110 Vomiting, unspecified: Secondary | ICD-10-CM | POA: Diagnosis not present

## 2019-01-20 DIAGNOSIS — M79641 Pain in right hand: Secondary | ICD-10-CM | POA: Diagnosis not present

## 2019-01-20 DIAGNOSIS — R200 Anesthesia of skin: Secondary | ICD-10-CM | POA: Diagnosis not present

## 2019-01-20 DIAGNOSIS — I10 Essential (primary) hypertension: Secondary | ICD-10-CM | POA: Diagnosis not present

## 2019-01-29 DIAGNOSIS — I10 Essential (primary) hypertension: Secondary | ICD-10-CM | POA: Diagnosis not present

## 2019-01-29 DIAGNOSIS — R52 Pain, unspecified: Secondary | ICD-10-CM | POA: Diagnosis not present

## 2019-01-29 DIAGNOSIS — M180 Bilateral primary osteoarthritis of first carpometacarpal joints: Secondary | ICD-10-CM | POA: Diagnosis not present

## 2019-07-28 DIAGNOSIS — H6980 Other specified disorders of Eustachian tube, unspecified ear: Secondary | ICD-10-CM | POA: Diagnosis not present

## 2019-07-28 DIAGNOSIS — I10 Essential (primary) hypertension: Secondary | ICD-10-CM | POA: Diagnosis not present

## 2019-09-16 DIAGNOSIS — Z1231 Encounter for screening mammogram for malignant neoplasm of breast: Secondary | ICD-10-CM | POA: Diagnosis not present

## 2019-09-22 DIAGNOSIS — M47812 Spondylosis without myelopathy or radiculopathy, cervical region: Secondary | ICD-10-CM | POA: Diagnosis not present

## 2019-09-22 DIAGNOSIS — D649 Anemia, unspecified: Secondary | ICD-10-CM | POA: Diagnosis not present

## 2019-09-22 DIAGNOSIS — M25511 Pain in right shoulder: Secondary | ICD-10-CM | POA: Diagnosis not present

## 2019-09-22 DIAGNOSIS — M19011 Primary osteoarthritis, right shoulder: Secondary | ICD-10-CM | POA: Diagnosis not present

## 2019-09-22 DIAGNOSIS — R42 Dizziness and giddiness: Secondary | ICD-10-CM | POA: Diagnosis not present

## 2019-09-22 DIAGNOSIS — R0789 Other chest pain: Secondary | ICD-10-CM | POA: Diagnosis not present

## 2019-09-22 DIAGNOSIS — R05 Cough: Secondary | ICD-10-CM | POA: Diagnosis not present

## 2019-09-22 DIAGNOSIS — R531 Weakness: Secondary | ICD-10-CM | POA: Diagnosis not present

## 2019-09-22 DIAGNOSIS — M542 Cervicalgia: Secondary | ICD-10-CM | POA: Diagnosis not present

## 2019-09-22 DIAGNOSIS — Z124 Encounter for screening for malignant neoplasm of cervix: Secondary | ICD-10-CM | POA: Diagnosis not present

## 2019-09-22 DIAGNOSIS — M50321 Other cervical disc degeneration at C4-C5 level: Secondary | ICD-10-CM | POA: Diagnosis not present

## 2019-09-22 DIAGNOSIS — I10 Essential (primary) hypertension: Secondary | ICD-10-CM | POA: Diagnosis not present

## 2019-09-22 DIAGNOSIS — Z0000 Encounter for general adult medical examination without abnormal findings: Secondary | ICD-10-CM | POA: Diagnosis not present

## 2019-09-22 DIAGNOSIS — G8929 Other chronic pain: Secondary | ICD-10-CM | POA: Diagnosis not present

## 2019-09-22 LAB — HM PAP SMEAR

## 2019-12-12 DIAGNOSIS — H18513 Endothelial corneal dystrophy, bilateral: Secondary | ICD-10-CM | POA: Diagnosis not present

## 2019-12-12 DIAGNOSIS — H2513 Age-related nuclear cataract, bilateral: Secondary | ICD-10-CM | POA: Diagnosis not present

## 2019-12-12 DIAGNOSIS — H18533 Granular corneal dystrophy, bilateral: Secondary | ICD-10-CM | POA: Diagnosis not present

## 2019-12-12 DIAGNOSIS — Z8679 Personal history of other diseases of the circulatory system: Secondary | ICD-10-CM | POA: Diagnosis not present

## 2019-12-12 DIAGNOSIS — H524 Presbyopia: Secondary | ICD-10-CM | POA: Diagnosis not present

## 2019-12-12 DIAGNOSIS — H52223 Regular astigmatism, bilateral: Secondary | ICD-10-CM | POA: Diagnosis not present

## 2019-12-12 DIAGNOSIS — H5213 Myopia, bilateral: Secondary | ICD-10-CM | POA: Diagnosis not present

## 2020-04-27 DIAGNOSIS — R300 Dysuria: Secondary | ICD-10-CM | POA: Diagnosis not present

## 2020-04-27 DIAGNOSIS — E785 Hyperlipidemia, unspecified: Secondary | ICD-10-CM | POA: Diagnosis not present

## 2020-04-27 DIAGNOSIS — I10 Essential (primary) hypertension: Secondary | ICD-10-CM | POA: Diagnosis not present

## 2020-04-27 DIAGNOSIS — R05 Cough: Secondary | ICD-10-CM | POA: Diagnosis not present

## 2020-04-27 DIAGNOSIS — R7309 Other abnormal glucose: Secondary | ICD-10-CM | POA: Diagnosis not present

## 2020-04-27 DIAGNOSIS — J410 Simple chronic bronchitis: Secondary | ICD-10-CM | POA: Diagnosis not present

## 2020-04-27 DIAGNOSIS — M79671 Pain in right foot: Secondary | ICD-10-CM | POA: Diagnosis not present

## 2020-04-27 DIAGNOSIS — M79672 Pain in left foot: Secondary | ICD-10-CM | POA: Diagnosis not present

## 2020-05-13 ENCOUNTER — Telehealth: Payer: Self-pay | Admitting: Family Medicine

## 2020-05-13 DIAGNOSIS — Z20822 Contact with and (suspected) exposure to covid-19: Secondary | ICD-10-CM | POA: Diagnosis not present

## 2020-05-13 DIAGNOSIS — R05 Cough: Secondary | ICD-10-CM | POA: Diagnosis not present

## 2020-05-13 DIAGNOSIS — F17200 Nicotine dependence, unspecified, uncomplicated: Secondary | ICD-10-CM | POA: Diagnosis not present

## 2020-05-13 DIAGNOSIS — R634 Abnormal weight loss: Secondary | ICD-10-CM | POA: Diagnosis not present

## 2020-05-13 NOTE — Telephone Encounter (Signed)
Ok for a new patient appointment.

## 2020-05-13 NOTE — Telephone Encounter (Signed)
Please advise 

## 2020-05-13 NOTE — Telephone Encounter (Signed)
Patients sister (Raja,Ringgold) who is a current patient of Dr.Tabori's called on behalf of patient requesting to have patient TOC with Dr.Tabori. Please advise?

## 2020-05-13 NOTE — Telephone Encounter (Signed)
Ok to accept.  Pt is a new pt b/c she has been going to [facility]

## 2020-06-11 ENCOUNTER — Ambulatory Visit (INDEPENDENT_AMBULATORY_CARE_PROVIDER_SITE_OTHER): Payer: BC Managed Care – PPO | Admitting: Family Medicine

## 2020-06-11 ENCOUNTER — Encounter: Payer: Self-pay | Admitting: Family Medicine

## 2020-06-11 ENCOUNTER — Other Ambulatory Visit: Payer: Self-pay

## 2020-06-11 VITALS — BP 122/70 | HR 91 | Temp 98.9°F | Resp 20 | Ht 61.0 in | Wt 149.8 lb

## 2020-06-11 DIAGNOSIS — K219 Gastro-esophageal reflux disease without esophagitis: Secondary | ICD-10-CM

## 2020-06-11 DIAGNOSIS — I10 Essential (primary) hypertension: Secondary | ICD-10-CM

## 2020-06-11 DIAGNOSIS — J309 Allergic rhinitis, unspecified: Secondary | ICD-10-CM | POA: Diagnosis not present

## 2020-06-11 DIAGNOSIS — E785 Hyperlipidemia, unspecified: Secondary | ICD-10-CM | POA: Diagnosis not present

## 2020-06-11 MED ORDER — OMEPRAZOLE 20 MG PO CPDR: 20.0000 mg | DELAYED_RELEASE_CAPSULE | Freq: Every day | ORAL | 3 refills | Status: DC

## 2020-06-11 MED ORDER — PROMETHAZINE-DM 6.25-15 MG/5ML PO SYRP: 5.0000 mL | ORAL_SOLUTION | Freq: Four times a day (QID) | ORAL | 0 refills | Status: DC | PRN

## 2020-06-11 MED ORDER — VALSARTAN-HYDROCHLOROTHIAZIDE 80-12.5 MG PO TABS: 1.0000 | ORAL_TABLET | Freq: Every day | ORAL | 1 refills | Status: DC

## 2020-06-11 MED ORDER — AMLODIPINE BESYLATE 10 MG PO TABS: 10.0000 mg | ORAL_TABLET | Freq: Every day | ORAL | 1 refills | Status: DC

## 2020-06-11 MED ORDER — CETIRIZINE HCL 10 MG PO TABS: 10.0000 mg | ORAL_TABLET | Freq: Every day | ORAL | 11 refills | Status: DC

## 2020-06-11 NOTE — Progress Notes (Signed)
   Subjective:    Patient ID: Patricia Kennedy, female    DOB: 06/07/1966, 54 y.o.   MRN: [id number]  HPI HTN- chronic problem, on Amlodipine 10mg  and Valsartan HCTZ 80/12.5mg  daily w/ good control  Hyperlipidemia- chronic problem.  Not currently on medication. Had labs done 04/27/20 and LDL 125   Cough- sxs started ~2 months ago, 'when the weather changes'.  Pt reports having seasonal allergies but she is not on any allergy medication.  + cough, sneezing.  Pt went to UC and was given albuterol nebulizer.  Pt reports OTC cough medication is not helping.  'there is infection in my throat'.    GERD- 'all the time burping', 'a lot of gas'.   Review of Systems For ROS see HPI   This visit occurred during the SARS-CoV-2 public health emergency.  Safety protocols were in place, including screening questions prior to the visit, additional usage of staff PPE, and extensive cleaning of exam room while observing appropriate contact time as indicated for disinfecting solutions.       Objective:   Physical Exam Vitals reviewed.  Constitutional:      General: She is not in acute distress.    Appearance: Normal appearance. She is well-developed. She is not ill-appearing.  HENT:     Head: Normocephalic and atraumatic.  Eyes:     Conjunctiva/sclera: Conjunctivae normal.     Pupils: Pupils are equal, round, and reactive to light.  Neck:     Thyroid: No thyromegaly.  Cardiovascular:     Rate and Rhythm: Normal rate and regular rhythm.     Heart sounds: Normal heart sounds. No murmur heard.   Pulmonary:     Effort: Pulmonary effort is normal. No respiratory distress.     Breath sounds: Normal breath sounds.  Abdominal:     General: There is no distension.     Palpations: Abdomen is soft.     Tenderness: There is no abdominal tenderness.  Musculoskeletal:     Cervical back: Normal range of motion and neck supple.  Lymphadenopathy:     Cervical: No cervical adenopathy.  Skin:    General: Skin  is warm and dry.  Neurological:     Mental Status: She is alert and oriented to person, place, and time.  Psychiatric:        Behavior: Behavior normal.           Assessment & Plan:

## 2020-06-11 NOTE — Assessment & Plan Note (Signed)
Pt reports her cough started ~2 months ago 'when the weather changed'.  Sister and pt both report pt has seasonal allergies but she is not currently on medication.  She has been to previous PCP and UC and has been treated w/ abx but no improvement in sxs.  The 'infection in my throat' is actually post-nasal drip and her sxs won't improve until the underlying allergies are treated.  Start Zyrtec daily.  Reviewed supportive care and red flags that should prompt return.  Pt expressed understanding and is in agreement w/ plan.

## 2020-06-11 NOTE — Assessment & Plan Note (Signed)
New to provider, ongoing for pt.  Well controlled today on Amlodipine 10mg  daily and Valsartan HCTZ 80/12.5mg  daily.  Reviewed recent labs done at [facility] and no med changes at this time.  Will follow.

## 2020-06-11 NOTE — Assessment & Plan Note (Signed)
New to provider, pt has hx of similar.  She is not currently on medication and reports that she is 'all the time burping' and makes motion w/ her hand up into her chest.  Will start PPI to control sxs.  Pt expressed understanding and is in agreement w/ plan.

## 2020-06-11 NOTE — Patient Instructions (Addendum)
Schedule your complete physical for February No need for blood work today START the Cetirizine once daily for your allergies TAKE the Omeprazole once daily for the stomach symptoms USE the cough syrup as needed I sent your blood pressure medications to the pharmacy Please STOP smoking! Call with any questions or concerns Feel Better!

## 2020-06-11 NOTE — Assessment & Plan Note (Signed)
New to provider, ongoing for pt.  She has not been on a statin in the past for her cholesterol.  LDL on 04/27/20 was 125.  No need for medication at this time but encouraged her to work on healthy diet, regular exercise, and smoking cessation.

## 2020-08-31 DIAGNOSIS — Z23 Encounter for immunization: Secondary | ICD-10-CM | POA: Diagnosis not present

## 2020-09-01 DIAGNOSIS — R1110 Vomiting, unspecified: Secondary | ICD-10-CM | POA: Diagnosis not present

## 2020-09-01 DIAGNOSIS — R42 Dizziness and giddiness: Secondary | ICD-10-CM | POA: Diagnosis not present

## 2020-09-01 DIAGNOSIS — R5383 Other fatigue: Secondary | ICD-10-CM | POA: Diagnosis not present

## 2020-09-01 DIAGNOSIS — T50B95A Adverse effect of other viral vaccines, initial encounter: Secondary | ICD-10-CM | POA: Diagnosis not present

## 2020-09-02 ENCOUNTER — Telehealth: Payer: BC Managed Care – PPO | Admitting: Physician Assistant

## 2020-09-14 DIAGNOSIS — H5213 Myopia, bilateral: Secondary | ICD-10-CM | POA: Diagnosis not present

## 2020-09-14 DIAGNOSIS — H2513 Age-related nuclear cataract, bilateral: Secondary | ICD-10-CM | POA: Diagnosis not present

## 2020-09-14 DIAGNOSIS — H18593 Other hereditary corneal dystrophies, bilateral: Secondary | ICD-10-CM | POA: Diagnosis not present

## 2020-09-15 ENCOUNTER — Other Ambulatory Visit: Payer: Self-pay

## 2020-09-15 ENCOUNTER — Ambulatory Visit: Payer: BC Managed Care – PPO | Admitting: Family Medicine

## 2020-09-15 ENCOUNTER — Encounter: Payer: Self-pay | Admitting: Family Medicine

## 2020-09-15 VITALS — BP 110/70 | HR 81 | Temp 98.9°F | Resp 17 | Ht 61.0 in | Wt 150.0 lb

## 2020-09-15 DIAGNOSIS — I10 Essential (primary) hypertension: Secondary | ICD-10-CM

## 2020-09-15 DIAGNOSIS — R55 Syncope and collapse: Secondary | ICD-10-CM | POA: Diagnosis not present

## 2020-09-15 DIAGNOSIS — J309 Allergic rhinitis, unspecified: Secondary | ICD-10-CM

## 2020-09-15 MED ORDER — VALSARTAN-HYDROCHLOROTHIAZIDE 80-12.5 MG PO TABS: 1.0000 | ORAL_TABLET | Freq: Every day | ORAL | 1 refills | Status: DC

## 2020-09-15 MED ORDER — AMLODIPINE BESYLATE 10 MG PO TABS: 10.0000 mg | ORAL_TABLET | Freq: Every day | ORAL | 1 refills | Status: DC

## 2020-09-15 MED ORDER — FLUTICASONE PROPIONATE 50 MCG/ACT NA SUSP: 2.0000 | Freq: Every day | NASAL | 6 refills | Status: DC

## 2020-09-15 NOTE — Patient Instructions (Signed)
Schedule your complete physical for June See if you can get your 2nd shot on a day you are not working Before your next shot make sure you drink LOTS of water, have had something to eat, and have someone to drive you if needed CONTINUE the daily Zyrtec and add the Flonase nasal spray Call with any questions or concerns Stay Safe!  Stay Healthy!

## 2020-09-15 NOTE — Progress Notes (Signed)
   Subjective:    Patient ID: Patricia Kennedy, female    DOB: March 14, 1966, 55 y.o.   MRN: [id number]  HPI HTN- chronic problem.  Well controlled on Amlodipine 10mg  daily and Valsartan HCTZ 80/12.5mg  daily.  Needs refills on both.  COVID vaccine- pt got first COVID shot on 08/31/20 and w/in 5 minutes she was dizzy, BP dropped, nausea.  They called 911 due to hypotension.  By the time EMS arrived, vitals were back to normal.  + sore throat- some cough.  Pt reports this is allergy related.  + dry nasal passages.   Review of Systems For ROS see HPI   This visit occurred during the SARS-CoV-2 public health emergency.  Safety protocols were in place, including screening questions prior to the visit, additional usage of staff PPE, and extensive cleaning of exam room while observing appropriate contact time as indicated for disinfecting solutions.       Objective:   Physical Exam Vitals reviewed.  Constitutional:      General: She is not in acute distress.    Appearance: Normal appearance.  HENT:     Head: Normocephalic and atraumatic.  Eyes:     Extraocular Movements: Extraocular movements intact.     Conjunctiva/sclera: Conjunctivae normal.     Pupils: Pupils are equal, round, and reactive to light.  Cardiovascular:     Rate and Rhythm: Normal rate and regular rhythm.     Pulses: Normal pulses.  Pulmonary:     Effort: Pulmonary effort is normal.     Breath sounds: Normal breath sounds.  Skin:    General: Skin is warm and dry.  Neurological:     General: No focal deficit present.     Mental Status: She is alert and oriented to person, place, and time.           Assessment & Plan:  Vagal response- pt had a vagal episode after her 1st COVID vaccine.  She had worked a 12 hr day, had not eaten, and was not well hydrated.  Encouraged her to schedule her 2nd vaccine on her day off and make sure she is well hydrated and has eaten.  Pt expressed understanding and is in agreement w/ plan.

## 2020-09-15 NOTE — Assessment & Plan Note (Signed)
Chronic problem.  Well controlled on Amlodipine 10mg  daily and Valsartan HCTZ 80/12.5mg  daily.  Refills provided for pt

## 2020-09-15 NOTE — Assessment & Plan Note (Signed)
Ongoing issue for pt.  She is adamant that her sore throat, congestion, and cough are not COVID.  Encouraged daily use of antihistamine and will add nasal steroid.  Pt expressed understanding and is in agreement w/ plan.

## 2020-10-29 ENCOUNTER — Encounter: Payer: BC Managed Care – PPO | Admitting: Family Medicine

## 2021-01-28 ENCOUNTER — Encounter: Payer: BC Managed Care – PPO | Admitting: Family Medicine

## 2021-02-24 ENCOUNTER — Other Ambulatory Visit: Payer: Self-pay

## 2021-02-24 ENCOUNTER — Encounter: Payer: Self-pay | Admitting: Family Medicine

## 2021-02-24 ENCOUNTER — Ambulatory Visit (INDEPENDENT_AMBULATORY_CARE_PROVIDER_SITE_OTHER): Payer: BC Managed Care – PPO | Admitting: Family Medicine

## 2021-02-24 VITALS — BP 120/80 | HR 89 | Temp 99.6°F | Resp 20 | Ht 61.0 in | Wt 150.0 lb

## 2021-02-24 DIAGNOSIS — Z1211 Encounter for screening for malignant neoplasm of colon: Secondary | ICD-10-CM

## 2021-02-24 DIAGNOSIS — Z0000 Encounter for general adult medical examination without abnormal findings: Secondary | ICD-10-CM | POA: Diagnosis not present

## 2021-02-24 DIAGNOSIS — Z1231 Encounter for screening mammogram for malignant neoplasm of breast: Secondary | ICD-10-CM

## 2021-02-24 DIAGNOSIS — R5383 Other fatigue: Secondary | ICD-10-CM

## 2021-02-24 DIAGNOSIS — R0683 Snoring: Secondary | ICD-10-CM | POA: Diagnosis not present

## 2021-02-24 DIAGNOSIS — B351 Tinea unguium: Secondary | ICD-10-CM

## 2021-02-24 DIAGNOSIS — I10 Essential (primary) hypertension: Secondary | ICD-10-CM | POA: Diagnosis not present

## 2021-02-24 DIAGNOSIS — Z1159 Encounter for screening for other viral diseases: Secondary | ICD-10-CM

## 2021-02-24 DIAGNOSIS — Z114 Encounter for screening for human immunodeficiency virus [HIV]: Secondary | ICD-10-CM | POA: Diagnosis not present

## 2021-02-24 LAB — BASIC METABOLIC PANEL
BUN: 14 mg/dL (ref 6–23)
CO2: 24 mEq/L (ref 19–32)
Calcium: 9.7 mg/dL (ref 8.4–10.5)
Chloride: 104 mEq/L (ref 96–112)
Creatinine, Ser: 0.56 mg/dL (ref 0.40–1.20)
GFR: 101.26 mL/min (ref >60.00)
Glucose, Bld: 79 mg/dL (ref 70–99)
Potassium: 3.9 mEq/L (ref 3.5–5.1)
Sodium: 140 mEq/L (ref 135–145)

## 2021-02-24 LAB — LIPID PANEL
Cholesterol: 255 mg/dL — ABNORMAL HIGH (ref 0–200)
HDL: 61.9 mg/dL (ref >39.00)
LDL Cholesterol: 160 mg/dL — ABNORMAL HIGH (ref 0–99)
NonHDL: 192.89
Total CHOL/HDL Ratio: 4
Triglycerides: 163 mg/dL — ABNORMAL HIGH (ref 0.0–149.0)
VLDL: 32.6 mg/dL (ref 0.0–40.0)

## 2021-02-24 LAB — CBC WITH DIFFERENTIAL/PLATELET
Basophils Absolute: 0 10*3/uL (ref 0.0–0.1)
Basophils Relative: 0.4 % (ref 0.0–3.0)
Eosinophils Absolute: 0.1 10*3/uL (ref 0.0–0.7)
Eosinophils Relative: 1.5 % (ref 0.0–5.0)
HCT: 44.2 % (ref 36.0–46.0)
Hemoglobin: 15.4 g/dL — ABNORMAL HIGH (ref 12.0–15.0)
Lymphocytes Relative: 28.7 % (ref 12.0–46.0)
Lymphs Abs: 2.1 10*3/uL (ref 0.7–4.0)
MCHC: 34.8 g/dL (ref 30.0–36.0)
MCV: 90.3 fl (ref 78.0–100.0)
Monocytes Absolute: 0.5 10*3/uL (ref 0.1–1.0)
Monocytes Relative: 6.7 % (ref 3.0–12.0)
Neutro Abs: 4.5 10*3/uL (ref 1.4–7.7)
Neutrophils Relative %: 62.7 % (ref 43.0–77.0)
Platelets: 216 10*3/uL (ref 150.0–400.0)
RBC: 4.9 Mil/uL (ref 3.87–5.11)
RDW: 12.7 % (ref 11.5–15.5)
WBC: 7.2 10*3/uL (ref 4.0–10.5)

## 2021-02-24 LAB — HEPATIC FUNCTION PANEL
ALT: 22 U/L (ref 0–35)
AST: 17 U/L (ref 0–37)
Albumin: 4.5 g/dL (ref 3.5–5.2)
Alkaline Phosphatase: 88 U/L (ref 39–117)
Bilirubin, Direct: 0.1 mg/dL (ref 0.0–0.3)
Total Bilirubin: 0.5 mg/dL (ref 0.2–1.2)
Total Protein: 6.7 g/dL (ref 6.0–8.3)

## 2021-02-24 LAB — VITAMIN D 25 HYDROXY (VIT D DEFICIENCY, FRACTURES): VITD: 24.87 ng/mL — ABNORMAL LOW (ref 30.00–100.00)

## 2021-02-24 LAB — B12 AND FOLATE PANEL
Folate: 17.8 ng/mL (ref >5.9)
Vitamin B-12: 1090 pg/mL — ABNORMAL HIGH (ref 211–911)

## 2021-02-24 LAB — TSH: TSH: 1.22 u[IU]/mL (ref 0.35–5.50)

## 2021-02-24 MED ORDER — CICLOPIROX 8 % EX SOLN: Freq: Every day | CUTANEOUS | 0 refills | Status: DC

## 2021-02-24 NOTE — Patient Instructions (Addendum)
Follow up in 6 months to recheck BP We'll notify you of your lab results and make any changes if needed We'll call you with your pulmonary appt to check for sleep apnea Try and quit smoking! Focus on healthy diet and exercise as you are able Use the Ciclopirox on the nails as directed Continue to use Aloe on the sunburn Call with any questions or concerns Hang in there!!!

## 2021-02-24 NOTE — Progress Notes (Signed)
Subjective:    Patient ID: Patricia Kennedy, female    DOB: April 21, 1966, 55 y.o.   MRN: [id number]  HPI CPE- UTD on pap, Tdap.  Due for colonoscopy- pt refuses, mammo  Reviewed past medical, surgical, family and social histories.   Health Maintenance  Topic Date Due   Pneumococcal Vaccine 41-43 Years old (1 - PCV) Never done   HIV Screening  Never done   Hepatitis C Screening  Never done   COLONOSCOPY (Pts 45-23yrs Insurance coverage will need to be confirmed)  Never done   Zoster Vaccines- Shingrix (1 of 2) Never done   COVID-19 Vaccine (2 - Pfizer series) 03/12/2021 (Originally 09/21/2020)   INFLUENZA VACCINE  03/21/2021   MAMMOGRAM  09/16/2021   PAP SMEAR-Modifier  09/21/2022   TETANUS/TDAP  02/20/2023   HPV VACCINES  Aged Out      Review of Systems Patient reports no vision/hearing changes, adenopathy,fever, weight change, persistant/recurrent hoarseness , swallowing issues, chest pain, palpitations, edema, persistant/recurrent cough, hemoptysis, dyspnea (rest/exertional/paroxysmal nocturnal), gastrointestinal bleeding (melena, rectal bleeding), abdominal pain, bowel changes, GU symptoms (dysuria, hematuria, incontinence), Gyn symptoms (abnormal  bleeding, pain),  syncope, focal weakness, memory loss, numbness & tingling, hair changes, abnormal bruising or bleeding, anxiety, or depression.   + fatigue- family reports loud snoring and witnessed breathing pauses.  Pt naps often + yellow toe nails + sun burn + GERD- improves w/ Tums  This visit occurred during the SARS-CoV-2 public health emergency.  Safety protocols were in place, including screening questions prior to the visit, additional usage of staff PPE, and extensive cleaning of exam room while observing appropriate contact time as indicated for disinfecting solutions.      Objective:   Physical Exam General Appearance:    Alert, cooperative, no distress, appears stated age  Head:    Normocephalic, without obvious  abnormality, atraumatic  Eyes:    PERRL, conjunctiva/corneas clear, EOM's intact, fundi    benign, both eyes  Ears:    Normal TM's and external ear canals, both ears  Nose:   Deferred due to COVID  Throat:   Neck:   Supple, symmetrical, trachea midline, no adenopathy;    Thyroid: no enlargement/tenderness/nodules  Back:     Symmetric, no curvature, ROM normal, no CVA tenderness  Lungs:     Clear to auscultation bilaterally, respirations unlabored  Chest Wall:    No tenderness or deformity   Heart:    Regular rate and rhythm, S1 and S2 normal, no murmur, rub   or gallop  Breast Exam:    Deferred to mammo  Abdomen:     Soft, non-tender, bowel sounds active all four quadrants,    no masses, no organomegaly  Genitalia:    Deferred   Rectal:    Extremities:   Extremities normal, atraumatic, no cyanosis or edema  Pulses:   2+ and symmetric all extremities  Skin:   Skin color, texture, turgor normal, no rashes or lesions.  Bilateral nail fungus  Lymph nodes:   Cervical, supraclavicular, and axillary nodes normal  Neurologic:   CNII-XII intact, normal strength, sensation and reflexes    throughout          Assessment & Plan:   Loud snoring- new.  Pt has loud snoring and breathing pauses while sleeping.  Suspect OSA.  Will refer to pulmonary for complete evaluation.  Pt expressed understanding and is in agreement w/ plan.   Toenail fungus- new.  Start ciclopirox as directed.  Fatigue- likely due to underlying  OSA but will check labs to r/o metabolic cause.

## 2021-02-25 ENCOUNTER — Other Ambulatory Visit: Payer: Self-pay

## 2021-02-25 LAB — HEPATITIS C ANTIBODY
Hepatitis C Ab: NONREACTIVE
SIGNAL TO CUT-OFF: 0.02 (ref <1.00)

## 2021-02-25 LAB — HIV ANTIBODY (ROUTINE TESTING W REFLEX): HIV 1&2 Ab, 4th Generation: NONREACTIVE

## 2021-03-01 ENCOUNTER — Other Ambulatory Visit: Payer: Self-pay

## 2021-03-01 DIAGNOSIS — E559 Vitamin D deficiency, unspecified: Secondary | ICD-10-CM

## 2021-03-01 DIAGNOSIS — E785 Hyperlipidemia, unspecified: Secondary | ICD-10-CM

## 2021-03-01 MED ORDER — ATORVASTATIN CALCIUM 20 MG PO TABS: 20.0000 mg | ORAL_TABLET | Freq: Every day | ORAL | 3 refills | Status: DC

## 2021-03-01 MED ORDER — VITAMIN D (ERGOCALCIFEROL) 1.25 MG (50000 UNIT) PO CAPS: 50000.0000 [IU] | ORAL_CAPSULE | ORAL | 0 refills | Status: DC

## 2021-03-01 NOTE — Progress Notes (Signed)
Called and left pt a vm to call office back about lab results.

## 2021-03-08 DIAGNOSIS — Z1211 Encounter for screening for malignant neoplasm of colon: Secondary | ICD-10-CM | POA: Diagnosis not present

## 2021-03-18 LAB — COLOGUARD: Cologuard: NEGATIVE

## 2021-03-21 ENCOUNTER — Telehealth: Payer: Self-pay

## 2021-03-21 NOTE — Telephone Encounter (Signed)
Pt is calling saying this medication atorvastatin (LIPITOR) 20 MG tablet is making her blood pressure to high and making weak this is not working for her and she is wanting to know if she needs to do a Med check?   Patient is also wanting to know if she can come and do blood working two week to check her cholesterol again?   Pt is wanting a call back on her Cologuard results I see in the notes Martie Lee spoke with here but she don't remember.   Pt [phone] Angelyn Punt) sister pt is wanting you to call her phone number

## 2021-03-21 NOTE — Telephone Encounter (Signed)
Patient scheduled for a medication check 03/22/21 with NP Kateri Plummer.

## 2021-03-23 ENCOUNTER — Encounter: Payer: Self-pay | Admitting: Registered Nurse

## 2021-03-23 ENCOUNTER — Ambulatory Visit: Payer: BC Managed Care – PPO | Admitting: Registered Nurse

## 2021-03-23 ENCOUNTER — Other Ambulatory Visit: Payer: Self-pay

## 2021-03-23 VITALS — BP 122/71 | HR 86 | Temp 98.4°F | Resp 18 | Ht 61.0 in | Wt 152.0 lb

## 2021-03-23 DIAGNOSIS — Z789 Other specified health status: Secondary | ICD-10-CM | POA: Diagnosis not present

## 2021-03-23 DIAGNOSIS — E785 Hyperlipidemia, unspecified: Secondary | ICD-10-CM

## 2021-03-23 DIAGNOSIS — R0681 Apnea, not elsewhere classified: Secondary | ICD-10-CM | POA: Diagnosis not present

## 2021-03-23 LAB — HEPATIC FUNCTION PANEL
ALT: 16 U/L (ref 0–35)
AST: 18 U/L (ref 0–37)
Albumin: 4.4 g/dL (ref 3.5–5.2)
Alkaline Phosphatase: 83 U/L (ref 39–117)
Bilirubin, Direct: 0.1 mg/dL (ref 0.0–0.3)
Total Bilirubin: 0.5 mg/dL (ref 0.2–1.2)
Total Protein: 6.6 g/dL (ref 6.0–8.3)

## 2021-03-23 LAB — CK: Total CK: 134 U/L (ref 7–177)

## 2021-03-23 MED ORDER — EZETIMIBE 10 MG PO TABS: 10.0000 mg | ORAL_TABLET | Freq: Every day | ORAL | 3 refills | Status: DC

## 2021-03-23 NOTE — Progress Notes (Signed)
Established Patient Office Visit  Subjective:  Patient ID: Patricia Kennedy, female    DOB: [birth year]  Age: 55 y.o. MRN: [id number]  CC:  Chief Complaint  Patient presents with   Medication Reaction    Patient states she has been having some medication reaction to atorvastatin. Patient states she was getting sleepy , high blood pressure and not feeling well. Also to discuss labs.    HPI [place] presents for medication reaction  Started on atorvastatin 20mg  po qd after lipid panel at [facility] noted results: Lab Results  Component Value Date   CHOL 255 (H) 02/24/2021   HDL 61.90 02/24/2021   LDLCALC 160 (H) 02/24/2021   TRIG 163.0 (H) 02/24/2021   CHOLHDL 4 02/24/2021   Notes she had sedation, high bp, malaise, some aches  No urinary symptoms   Apnea Witnessed by niece. Minumium 8 events per hour based on this count Wakes feeling tired, nonrestorative sleep Wakes with dry mouth and headache Strong fam hx of OSA Strong fam hx of CV disease, CVA, MI  Past Medical History:  Diagnosis Date   Dysuria 02/11/2016   Essential hypertension 02/11/2016   GERD (gastroesophageal reflux disease) 02/20/2016   Hypertension    Leg pain, right 02/11/2016    Past Surgical History:  Procedure Laterality Date   HYSTEROSCOPY     and D and C for incomplete miscarriage    Family History  Problem Relation Age of Onset   Heart disease Mother    Hypertension Mother    Stroke Mother    Heart disease Father    Hypertension Father    Heart disease Sister        palpitations   Heart disease Brother    Jaundice Maternal Grandmother        liver disease   GER disease Sister    Heart disease Brother    Heart disease Daughter        MI    Social History   Socioeconomic History   Marital status: Married    Spouse name: Not on file   Number of children: Not on file   Years of education: Not on file   Highest education level: Not on file  Occupational History   Not on file  Tobacco  Use   Smoking status: Every Day   Smokeless tobacco: Never  Substance and Sexual Activity   Alcohol use: No   Drug use: No   Sexual activity: Not on file  Other Topics Concern   Not on file  Social History Narrative   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    Following Ramadan at present   Wears seat belt   Social Determinants of Health   Financial Resource Strain: Not on file  Food Insecurity: Not on file  Transportation Needs: Not on file  Physical Activity: Not on file  Stress: Not on file  Social Connections: Not on file  Intimate Partner Violence: Not on file    Outpatient Medications Prior to Visit  Medication Sig Dispense Refill   amLODipine (NORVASC) 10 MG tablet Take 1 tablet (10 mg total) by mouth daily. 90 tablet 1   ciclopirox (PENLAC) 8 % solution Apply topically at bedtime. Apply over nail and surrounding skin. Apply daily over previous coat. After seven (7) days, may remove with alcohol and continue cycle. 6.6 mL 0   fluticasone (FLONASE) 50 MCG/ACT nasal spray Place 2 sprays into both nostrils daily. 16  g 6   valsartan-hydrochlorothiazide (DIOVAN-HCT) 80-12.5 MG tablet Take 1 tablet by mouth daily. 90 tablet 1   Vitamin D, Ergocalciferol, (DRISDOL) 1.25 MG (50000 UNIT) CAPS capsule Take 1 capsule (50,000 Units total) by mouth every 7 (seven) days. 12 capsule 0   atorvastatin (LIPITOR) 20 MG tablet Take 1 tablet (20 mg total) by mouth daily. (Patient not taking: Reported on 03/23/2021) 90 tablet 3   No facility-administered medications prior to visit.    Allergies  Allergen Reactions   Penicillins    Statins     Weakness, aches with atorvastatin    ROS Review of Systems  Constitutional: Negative.   HENT: Negative.    Eyes: Negative.   Respiratory: Negative.    Cardiovascular: Negative.   Gastrointestinal: Negative.   Genitourinary: Negative.   Musculoskeletal: Negative.   Skin: Negative.    Neurological: Negative.   Psychiatric/Behavioral: Negative.    All other systems reviewed and are negative.    Objective:    Physical Exam Vitals and nursing note reviewed.  Constitutional:      General: She is not in acute distress.    Appearance: Normal appearance. She is normal weight. She is not ill-appearing, toxic-appearing or diaphoretic.  Cardiovascular:     Rate and Rhythm: Normal rate and regular rhythm.     Heart sounds: Normal heart sounds. No murmur heard.   No friction rub. No gallop.  Pulmonary:     Effort: Pulmonary effort is normal. No respiratory distress.     Breath sounds: Normal breath sounds. No stridor. No wheezing, rhonchi or rales.  Chest:     Chest wall: No tenderness.  Skin:    General: Skin is warm and dry.  Neurological:     General: No focal deficit present.     Mental Status: She is alert and oriented to person, place, and time. Mental status is at baseline.  Psychiatric:        Mood and Affect: Mood normal.        Behavior: Behavior normal.        Thought Content: Thought content normal.        Judgment: Judgment normal.    BP 122/71   Pulse 86   Temp 98.4 F (36.9 C) (Temporal)   Resp 18   Ht 5\' 1"  (1.549 m)   Wt 152 lb (68.9 kg)   SpO2 99%   BMI 28.72 kg/m  Wt Readings from Last 3 Encounters:  03/23/21 152 lb (68.9 kg)  02/24/21 150 lb (68 kg)  09/15/20 150 lb (68 kg)     Health Maintenance Due  Topic Date Due   INFLUENZA VACCINE  03/21/2021    There are no preventive care reminders to display for this patient.  Lab Results  Component Value Date   TSH 1.22 02/24/2021   Lab Results  Component Value Date   WBC 7.2 02/24/2021   HGB 15.4 (H) 02/24/2021   HCT 44.2 02/24/2021   MCV 90.3 02/24/2021   PLT 216.0 02/24/2021   Lab Results  Component Value Date   NA 140 02/24/2021   K 3.9 02/24/2021   CO2 24 02/24/2021   GLUCOSE 79 02/24/2021   BUN 14 02/24/2021   CREATININE 0.56 02/24/2021   BILITOT 0.5 02/24/2021    ALKPHOS 88 02/24/2021   AST 17 02/24/2021   ALT 22 02/24/2021   PROT 6.7 02/24/2021   ALBUMIN 4.5 02/24/2021   CALCIUM 9.7 02/24/2021   GFR 101.26 02/24/2021   Lab Results  Component Value Date   CHOL 255 (H) 02/24/2021   Lab Results  Component Value Date   HDL 61.90 02/24/2021   Lab Results  Component Value Date   LDLCALC 160 (H) 02/24/2021   Lab Results  Component Value Date   TRIG 163.0 (H) 02/24/2021   Lab Results  Component Value Date   CHOLHDL 4 02/24/2021   No results found for: HGBA1C    Assessment & Plan:   Problem List Items Addressed This Visit       Other   Hyperlipidemia - Primary   Relevant Medications   ezetimibe (ZETIA) 10 MG tablet   Other Visit Diagnoses     Witnessed episode of apnea       Relevant Orders   Ambulatory referral to Neurology   Statin intolerance       Relevant Orders   CK   Hepatic function panel       Meds ordered this encounter  Medications   ezetimibe (ZETIA) 10 MG tablet    Sig: Take 1 tablet (10 mg total) by mouth daily.    Dispense:  90 tablet    Refill:  3    Order Specific Question:   Supervising Provider    Answer:   Neva Seat, JEFFREY R [[id number]]    Follow-up: Return if symptoms worsen or fail to improve.   PLAN Refer to neuro for witnessed apnea Change to zetia for lipids Check labs Patient encouraged to call clinic with any questions, comments, or concerns.  Janeece Agee, NP

## 2021-03-23 NOTE — Addendum Note (Signed)
Addended by: Laddie Aquas A on: 03/23/2021 09:51 AM   Modules accepted: Orders

## 2021-03-23 NOTE — Patient Instructions (Signed)
Ms. Patricia Kennedy to meet you  Stop the statin, start zetia. I think this will be easier to tolerate. Checking on labs today to ensure your liver is ok.  I will take responsibility for apnea management for now. If you do not hear from someone in 2-3 weeks, call me  Thank you  Luan Pulling

## 2021-03-28 ENCOUNTER — Ambulatory Visit: Payer: BC Managed Care – PPO | Admitting: Neurology

## 2021-03-28 ENCOUNTER — Encounter: Payer: Self-pay | Admitting: Neurology

## 2021-03-28 VITALS — BP 115/74 | HR 85 | Ht 61.0 in | Wt 153.0 lb

## 2021-03-28 DIAGNOSIS — R519 Headache, unspecified: Secondary | ICD-10-CM

## 2021-03-28 DIAGNOSIS — G4719 Other hypersomnia: Secondary | ICD-10-CM | POA: Diagnosis not present

## 2021-03-28 DIAGNOSIS — R0683 Snoring: Secondary | ICD-10-CM

## 2021-03-28 DIAGNOSIS — E663 Overweight: Secondary | ICD-10-CM

## 2021-03-28 DIAGNOSIS — R0681 Apnea, not elsewhere classified: Secondary | ICD-10-CM | POA: Diagnosis not present

## 2021-03-28 NOTE — Patient Instructions (Signed)

## 2021-03-28 NOTE — Progress Notes (Signed)
Subjective:    Patient ID: Patricia Kennedy is a 55 y.o. female.  HPI    Huston FoleySaima Bertil Brickey, MD, PhD [facility] [address], [address] [address] [place], [place] [address]  Dear Patricia Burdockichard,   I saw your patient, Patricia DittoHaifa Schul, on your kind request in my sleep clinic today for initial consultation of her sleep disorder, in particular, concern for underlying obstructive sleep apnea.  The patient is unaccompanied today, but we had Ventura SellersSahar Alsaber, on speaker phone, patient's niece who is now also on [place], niece lives in [place] and facilitated interpretation from Arabic to [place] and vice versa.  Patient also speaks some English herself.  As you know, Ms. Patricia DuralZaitawi is a 55 year old right-handed woman with an underlying medical history of hypertension, hyperlipidemia, reflux disease, and overweight state, who reports snoring and excessive daytime somnolence as well as witnessed apneas and recurrent morning headaches.  She does not wake up rested.  She has no night to night nocturia but difficulty maintaining sleep.  Of note, she works nights.  She typically works 3 nights on and 2 nights off, then 2 nights on and 3 nights of on average.  She works as a Pensions consultanttechnician.  She works 12-hour shifts, from 7 PM to 7 AM normally.  She lives with her sister, her brother, and the nephew, she has no children of her own.  I reviewed your office note from 03/23/2021.  Her Epworth sleepiness score is 9 out of 24, fatigue severity score is 14 out of 63.  Bedtime varies, she tries to be in bed around 8 or 8:30 AM and may sleep till 1 PM but often she is up earlier and up multiple times, denies night to night nocturia.  When she is off she typically sleeps at night.  She would be willing to consider an overnight sleep study on a day off but only if the insurance covers it.  Otherwise, she would prefer a home sleep test.  She has no TV in her bedroom.  She drinks a lot of caffeine in the form of coffee, about 6 to 8  cups/day on average and smokes a pack per day.  There may be a family history of sleep apnea per Sahar.  Snoring can be very loud per niece.  Her Past Medical History Is Significant For: Past Medical History:  Diagnosis Date   Dysuria 02/11/2016   Essential hypertension 02/11/2016   GERD (gastroesophageal reflux disease) 02/20/2016   Hypertension    Leg pain, right 02/11/2016    Her Past Surgical History Is Significant For: Past Surgical History:  Procedure Laterality Date   HYSTEROSCOPY     and D and C for incomplete miscarriage    Her Family History Is Significant For: Family History  Problem Relation Age of Onset   Heart disease Mother    Hypertension Mother    Stroke Mother    Heart disease Father    Hypertension Father    Heart disease Sister        palpitations   Heart disease Brother    Jaundice Maternal Grandmother        liver disease   GER disease Sister    Heart disease Brother    Heart disease Daughter        MI    Her Social History Is Significant For: Social History   Socioeconomic History   Marital status: Married    Spouse name: Not on file   Number of children: Not on  file   Years of education: Not on file   Highest education level: Not on file  Occupational History   Not on file  Tobacco Use   Smoking status: Every Day   Smokeless tobacco: Never  Substance and Sexual Activity   Alcohol use: No   Drug use: No   Sexual activity: Not on file  Other Topics Concern   Not on file  Social History Narrative   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    Following Ramadan at present   Wears seat belt   Social Determinants of Health   Financial Resource Strain: Not on file  Food Insecurity: Not on file  Transportation Needs: Not on file  Physical Activity: Not on file  Stress: Not on file  Social Connections: Not on file    Her Allergies Are:  Allergies  Allergen Reactions   Penicillins     Statins     Weakness, aches with atorvastatin  :   Her Current Medications Are:  Outpatient Encounter Medications as of 03/28/2021  Medication Sig   amLODipine (NORVASC) 10 MG tablet Take 1 tablet (10 mg total) by mouth daily.   ciclopirox (PENLAC) 8 % solution Apply topically at bedtime. Apply over nail and surrounding skin. Apply daily over previous coat. After seven (7) days, may remove with alcohol and continue cycle.   ezetimibe (ZETIA) 10 MG tablet Take 1 tablet (10 mg total) by mouth daily.   fluticasone (FLONASE) 50 MCG/ACT nasal spray Place 2 sprays into both nostrils daily.   valsartan-hydrochlorothiazide (DIOVAN-HCT) 80-12.5 MG tablet Take 1 tablet by mouth daily.   Vitamin D, Ergocalciferol, (DRISDOL) 1.25 MG (50000 UNIT) CAPS capsule Take 1 capsule (50,000 Units total) by mouth every 7 (seven) days.   No facility-administered encounter medications on file as of 03/28/2021.  :   Review of Systems:  Out of a complete 14 point review of systems, all are reviewed and negative with the exception of these symptoms as listed below:   Review of Systems  Neurological:        Pt has niece on phone .niece states that she has witnessed her stop breathing at times during the night and niece also says pt complains she is so tired when she gets up in the morning. Pt states never had sleep study done before   Epworth Sleepiness Scale 0= would never doze 1= slight chance of dozing 2= moderate chance of dozing 3= high chance of dozing  Sitting and reading: Watching TV:2 Sitting inactive in a public place (ex. Theater or meeting):1 As a passenger in a car for an hour without a break:3 Lying down to rest in the afternoon:0 Sitting and talking to someone:0 Sitting quietly after lunch (no alcohol):0 In a car, while stopped in traffic:3 Total: 9  FSS :14    Objective:  Neurological Exam  Physical Exam Physical Examination:   Vitals:   03/28/21 1058  BP: 115/74  Pulse: 85     General Examination: The patient is a very pleasant 55 y.o. female in no acute distress. She appears well-developed and well-nourished and well groomed.   HEENT: Normocephalic, atraumatic, pupils are equal, round and reactive to light, extraocular tracking is good without limitation to gaze excursion or nystagmus noted. Hearing is grossly intact. Face is symmetric with normal facial animation. Speech is clear with no dysarthria noted. There is no hypophonia. There is no lip, neck/head, jaw or voice tremor. Neck is supple  with full range of passive and active motion. There are no carotid bruits on auscultation. Oropharynx exam reveals: mild mouth dryness, adequate dental hygiene and moderate airway crowding, due to small airway entry, Mallampati class IV.  Uvula seems to be bicuspid, tonsils not fully visualized.  Tongue protrudes centrally.  Neck circumference 14 and three-quarter inches.  She has a mild overbite.    Chest: Clear to auscultation without wheezing, rhonchi or crackles noted.  Heart: S1+S2+0, regular and normal without murmurs, rubs or gallops noted.   Abdomen: Soft, non-tender and non-distended with normal bowel sounds appreciated on auscultation.  Extremities: There is no pitting edema in the distal lower extremities bilaterally.   Skin: Warm and dry without trophic changes noted.   Musculoskeletal: exam reveals no obvious joint deformities, tenderness or joint swelling or erythema.   Neurologically:  Mental status: The patient is awake, alert and oriented in all 4 spheres. Her immediate and remote memory, attention, language skills and fund of knowledge are appropriate. There is no evidence of aphasia, agnosia, apraxia or anomia. Speech is clear with normal prosody and enunciation. Thought process is linear. Mood is normal and affect is normal.  Cranial nerves II - XII are as described above under HEENT exam.  Motor exam: Normal bulk, strength and tone is noted. There is no  tremor, fine motor skills and coordination: grossly intact.  Cerebellar testing: No dysmetria or intention tremor. There is no truncal or gait ataxia.  Sensory exam: intact to light touch in the upper and lower extremities.  Gait, station and balance: She stands easily. No veering to one side is noted. No leaning to one side is noted. Posture is age-appropriate and stance is narrow based. Gait shows normal stride length and normal pace. No problems turning are noted.   Assessment and Plan:  In summary, Sameena Artus is a very pleasant 55 y.o.-year old female with an underlying medical history of hypertension, hyperlipidemia, reflux disease, and overweight state, whose history and physical exam are concerning for obstructive sleep apnea (OSA). I had a long chat with the patient and Sahar (on the phone) about my findings and the diagnosis of OSA, its prognosis and treatment options. We talked about medical treatments, surgical interventions and non-pharmacological approaches. I explained in particular the risks and ramifications of untreated moderate to severe OSA, especially with respect to developing cardiovascular disease down the Road, including congestive heart failure, difficult to treat hypertension, cardiac arrhythmias, or stroke. Even type 2 diabetes has, in part, been linked to untreated OSA. Symptoms of untreated OSA include daytime sleepiness, memory problems, mood irritability and mood disorder such as depression and anxiety, lack of energy, as well as recurrent headaches, especially morning headaches. We talked about smoking cessation and trying to maintain a healthy lifestyle in general, as well as the importance of weight control. We also talked about the importance of good sleep hygiene.  She was also given detailed written instructions. I recommended the following at this time: sleep study.  I outlined the difference between a laboratory attended sleep study versus home sleep test.  Patient  would be willing to stay overnight on the day off, so long as her insurance covers her for a laboratory attended sleep study.  She would be willing to do a home sleep test if need be.   I explained the sleep test procedure to the patient and also outlined possible surgical and non-surgical treatment options of OSA, including the use of a custom-made dental device (which would require  a referral to a specialist dentist or oral surgeon), upper airway surgical options, such as traditional UPPP or a novel less invasive surgical option in the form of Inspire hypoglossal nerve stimulation (which would involve a referral to an ENT surgeon). I also explained the CPAP treatment option to the patient, who indicated that she would be willing to try CPAP if the need arises.  We will pick up our discussion after testing.  We will call her to schedule her sleep study and also with the results.  We will plan a follow-up afterwards.  I answered all the questions today and the patient and her niece were in agreement.    Thank you very much for allowing me to participate in the care of this nice patient. If I can be of any further assistance to you please do not hesitate to call me at [phone].  Sincerely,   Huston Foley, MD, PhD

## 2021-04-07 ENCOUNTER — Telehealth: Payer: Self-pay

## 2021-04-07 NOTE — Telephone Encounter (Signed)
LVM for pt to call me back to schedule sleep study  

## 2021-04-11 ENCOUNTER — Telehealth: Payer: Self-pay

## 2021-04-11 NOTE — Telephone Encounter (Signed)
Pt lvm in sleep lab returning call to schedule HST and asked that we schedule this with her sister. I returned pt's call per her sister the pt is currently asleep. I told her that the pt will need to call us back letting us know if she wishes to proceed with our office or if she wants to see [facility] as she is schedule with them for 05/04/21 for a sleep consult. Pt's sister will have pt call us back.

## 2021-04-15 ENCOUNTER — Other Ambulatory Visit: Payer: BC Managed Care – PPO

## 2021-04-18 ENCOUNTER — Telehealth: Payer: Self-pay | Admitting: Neurology

## 2021-04-18 NOTE — Telephone Encounter (Signed)
We have attempted to call the patient 2 times to schedule sleep study. Patient has been unavailable at the phone numbers we have on file and has not returned our calls. If patient calls back we will schedule them for their sleep study. ° °

## 2021-04-22 ENCOUNTER — Other Ambulatory Visit: Payer: Self-pay | Admitting: Family Medicine

## 2021-04-22 DIAGNOSIS — Z1231 Encounter for screening mammogram for malignant neoplasm of breast: Secondary | ICD-10-CM

## 2021-04-28 NOTE — Assessment & Plan Note (Signed)
Pt's PE WNL w/ exception of toenail fungus and scalp sunburn. Due for colonoscopy- refuses.  Will do cologuard.  Agreed to do mammogram.  Declines all vaccinations today.  Check labs.  Anticipatory guidance provided.

## 2021-04-28 NOTE — Assessment & Plan Note (Signed)
Chronic problem.  Well controlled on current meds.  Check labs as she is on ARB.  No anticipated med changes.

## 2021-05-04 ENCOUNTER — Institutional Professional Consult (permissible substitution): Payer: BC Managed Care – PPO | Admitting: Pulmonary Disease

## 2021-05-20 ENCOUNTER — Ambulatory Visit: Payer: BC Managed Care – PPO

## 2021-06-01 ENCOUNTER — Ambulatory Visit: Payer: BC Managed Care – PPO

## 2021-06-06 ENCOUNTER — Institutional Professional Consult (permissible substitution): Payer: BC Managed Care – PPO | Admitting: Pulmonary Disease

## 2021-06-10 ENCOUNTER — Other Ambulatory Visit: Payer: Self-pay

## 2021-06-10 ENCOUNTER — Telehealth (INDEPENDENT_AMBULATORY_CARE_PROVIDER_SITE_OTHER): Payer: BC Managed Care – PPO | Admitting: Family Medicine

## 2021-06-10 DIAGNOSIS — J069 Acute upper respiratory infection, unspecified: Secondary | ICD-10-CM

## 2021-06-10 MED ORDER — ALBUTEROL SULFATE HFA 108 (90 BASE) MCG/ACT IN AERS: 2.0000 | INHALATION_SPRAY | RESPIRATORY_TRACT | 0 refills | Status: DC | PRN

## 2021-06-10 NOTE — Progress Notes (Signed)
Patient ID: Patricia Kennedy, female   DOB: 1966/05/28, 55 y.o.   MRN: [id number]  This visit type was conducted due to national recommendations for restrictions regarding the COVID-19 pandemic in an effort to limit this patient's exposure and mitigate transmission in our community.   Virtual Visit via Telephone Note  I connected with Patricia Kennedy on 06/10/21 at 10:15 AM EDT by telephone and verified that I am speaking with the correct person using two identifiers.   I discussed the limitations, risks, security and privacy concerns of performing an evaluation and management service by telephone and the availability of in person appointments. I also discussed with the patient that there may be a patient responsible charge related to this service. The patient expressed understanding and agreed to proceed.  Location patient: home Location provider: work or home office Participants present for the call: patient, provider Patient did not have a visit in the prior 7 days to address this/these issue(s).   History of Present Illness:  Patricia Kennedy has difficulty apparently with English and this call was facilitated by her sister-Patricia Kennedy.  Patient has had a couple of days of some nasal congestion and cough.  No fever.  Intermittent wheezing.  No known history of asthma.  COVID testing negative.  They were basically requesting refill of inhaler that she has used in the past.  Denies any nausea, vomiting, or diarrhea.  No sore throat symptoms.  No sick contacts.  Past Medical History:  Diagnosis Date   Dysuria 02/11/2016   Essential hypertension 02/11/2016   GERD (gastroesophageal reflux disease) 02/20/2016   Hypertension    Leg pain, right 02/11/2016   Past Surgical History:  Procedure Laterality Date   HYSTEROSCOPY     and D and C for incomplete miscarriage    reports that she has been smoking. She has never used smokeless tobacco. She reports that she does not drink alcohol and does not use drugs. family  history includes GER disease in her sister; Heart disease in her brother, brother, daughter, father, mother, and sister; Hypertension in her father and mother; Jaundice in her maternal grandmother; Stroke in her mother. Allergies  Allergen Reactions   Penicillins    Statins     Weakness, aches with atorvastatin      Observations/Objective: Patient sounds cheerful and well on the phone. I do not appreciate any SOB. Speech and thought processing are grossly intact. Patient reported vitals:  Assessment and Plan:  Probable viral URI.  Possible mild reactive airway component.  -Albuterol MDI 2 puffs every 4 hours as needed for cough and wheeze -Follow-up promptly for fever, increased shortness of breath, or other concern.  Follow Up Instructions:   99441 5-10 99442 11-20 [id number] 21-30 I did not refer this patient for an OV in the next 24 hours for this/these issue(s).  I discussed the assessment and treatment plan with the patient. The patient was provided an opportunity to ask questions and all were answered. The patient agreed with the plan and demonstrated an understanding of the instructions.   The patient was advised to call back or seek an in-person evaluation if the symptoms worsen or if the condition fails to improve as anticipated.  I provided 12 minutes of non-face-to-face time during this encounter.   Evelena Peat, MD

## 2021-06-13 ENCOUNTER — Telehealth: Payer: BC Managed Care – PPO | Admitting: Family Medicine

## 2021-06-14 ENCOUNTER — Telehealth: Payer: BC Managed Care – PPO | Admitting: Family Medicine

## 2021-06-15 DIAGNOSIS — Z6831 Body mass index (BMI) 31.0-31.9, adult: Secondary | ICD-10-CM | POA: Diagnosis not present

## 2021-06-15 DIAGNOSIS — J209 Acute bronchitis, unspecified: Secondary | ICD-10-CM | POA: Diagnosis not present

## 2021-06-15 DIAGNOSIS — E669 Obesity, unspecified: Secondary | ICD-10-CM | POA: Diagnosis not present

## 2021-06-15 DIAGNOSIS — J9801 Acute bronchospasm: Secondary | ICD-10-CM | POA: Diagnosis not present

## 2021-07-01 ENCOUNTER — Other Ambulatory Visit: Payer: Self-pay | Admitting: Family Medicine

## 2021-07-11 ENCOUNTER — Other Ambulatory Visit: Payer: Self-pay | Admitting: Family Medicine

## 2021-07-11 ENCOUNTER — Telehealth: Payer: Self-pay

## 2021-07-13 NOTE — Telephone Encounter (Signed)
Error

## 2021-08-26 ENCOUNTER — Ambulatory Visit: Payer: BC Managed Care – PPO | Admitting: Family Medicine

## 2021-08-29 ENCOUNTER — Encounter: Payer: Self-pay | Admitting: Family Medicine

## 2021-08-29 ENCOUNTER — Ambulatory Visit: Payer: BC Managed Care – PPO | Admitting: Family Medicine

## 2021-08-29 VITALS — BP 126/70 | HR 109 | Temp 100.0°F | Resp 18 | Wt 161.4 lb

## 2021-08-29 DIAGNOSIS — R509 Fever, unspecified: Secondary | ICD-10-CM

## 2021-08-29 DIAGNOSIS — R059 Cough, unspecified: Secondary | ICD-10-CM

## 2021-08-29 DIAGNOSIS — E785 Hyperlipidemia, unspecified: Secondary | ICD-10-CM | POA: Diagnosis not present

## 2021-08-29 LAB — POCT INFLUENZA A/B
Influenza A, POC: NEGATIVE
Influenza B, POC: NEGATIVE

## 2021-08-29 LAB — POC COVID19 BINAXNOW: SARS Coronavirus 2 Ag: NEGATIVE

## 2021-08-29 MED ORDER — GUAIFENESIN-CODEINE 100-10 MG/5ML PO SYRP: 10.0000 mL | ORAL_SOLUTION | Freq: Three times a day (TID) | ORAL | 0 refills | Status: DC | PRN

## 2021-08-29 MED ORDER — AMLODIPINE BESYLATE 10 MG PO TABS: 10.0000 mg | ORAL_TABLET | Freq: Every day | ORAL | 0 refills | Status: DC

## 2021-08-29 MED ORDER — EZETIMIBE 10 MG PO TABS: 10.0000 mg | ORAL_TABLET | Freq: Every day | ORAL | 3 refills | Status: DC

## 2021-08-29 MED ORDER — VALSARTAN-HYDROCHLOROTHIAZIDE 80-12.5 MG PO TABS: 1.0000 | ORAL_TABLET | Freq: Every day | ORAL | 0 refills | Status: DC

## 2021-08-29 MED ORDER — ALBUTEROL SULFATE HFA 108 (90 BASE) MCG/ACT IN AERS: 2.0000 | INHALATION_SPRAY | RESPIRATORY_TRACT | 0 refills | Status: DC | PRN

## 2021-08-29 MED ORDER — AZITHROMYCIN 250 MG PO TABS: ORAL_TABLET | ORAL | 0 refills | Status: DC

## 2021-08-29 NOTE — Progress Notes (Signed)
° °  Subjective:    Patient ID: Patricia Kennedy, female    DOB: 09/17/1965, 56 y.o.   MRN: [id number]  HPI Cough- pt reports multiple sick contacts at work.  Sxs started ~3 weeks ago.  On Friday 1/6 went to ER and was dx'd w/ viral and allergic process.  Pt is unable to eat or drink due to cough and post-tussive emesis.  Temp 100 today.  She was not aware of fever and cannot say if she has had fever at home.  + HA.  No relief w/ Tessalon.  No relief w/ OTC meds.  Some SOB- particularly w/ stairs.  Not sure if she is wheezing.  Cough is productive of white sputum.   Review of Systems For ROS see HPI   This visit occurred during the SARS-CoV-2 public health emergency.  Safety protocols were in place, including screening questions prior to the visit, additional usage of staff PPE, and extensive cleaning of exam room while observing appropriate contact time as indicated for disinfecting solutions.      Objective:   Physical Exam Vitals reviewed.  Constitutional:      General: She is not in acute distress.    Appearance: She is ill-appearing. She is not toxic-appearing.  HENT:     Head: Normocephalic and atraumatic.     Right Ear: Tympanic membrane and ear canal normal.     Left Ear: Tympanic membrane and ear canal normal.     Nose: Congestion and rhinorrhea present.     Comments: No TTP over frontal or maxillary sinuses Eyes:     Extraocular Movements: Extraocular movements intact.     Conjunctiva/sclera: Conjunctivae normal.     Pupils: Pupils are equal, round, and reactive to light.  Cardiovascular:     Rate and Rhythm: Regular rhythm. Tachycardia present.  Pulmonary:     Effort: No respiratory distress.     Breath sounds: No wheezing or rhonchi.     Comments: Wet, hacking cough but surprisingly no rales/rhonchi/wheeze Musculoskeletal:     Cervical back: Neck supple.  Lymphadenopathy:     Cervical: No cervical adenopathy.  Skin:    General: Skin is warm.  Neurological:     General:  No focal deficit present.     Mental Status: She is alert and oriented to person, place, and time.  Psychiatric:        Mood and Affect: Mood normal.        Behavior: Behavior normal.        Thought Content: Thought content normal.          Assessment & Plan:   Fever/cough- new.  Pt was seen in ER on 1/6 and dx'd w/ allergic/viral rhinitis after 2 weeks of sxs.  Flu and COVID tests are again negative today.  Reviewed CXR from 1/6 that showed no acute process.  WBC was at high end of normal at time of ER visit.  Will start abx for presumed bacterial infxn.  Cough medicine and albuterol prn.  Will hold on steroids at this time given possibility of bacterial infxn.  Pt to be out of work until feeling better.  Reviewed supportive care and red flags that should prompt return.  Pt expressed understanding and is in agreement w/ plan.

## 2021-08-29 NOTE — Patient Instructions (Addendum)
Follow up as scheduled- sooner if needed START the Azithromycin as directed.  2 today and then 1 tab daily starting tomorrow USE the cough syrup as needed- may cause drowsiness ADD OTC Delsym or Robitussin to help w/ cough Drink LOTS of fluids REST!!! USE the albuterol inhaler- 2 puffs every 4 hrs as needed for cough/shortness of breath/wheezing Call with any questions or concerns Hang in there!!!

## 2021-08-31 ENCOUNTER — Telehealth: Payer: Self-pay | Admitting: Family Medicine

## 2021-08-31 NOTE — Telephone Encounter (Signed)
Pt's sister called in stating that her sisters cough is not getting any better, she is still having coughing spells and chest the pain when she coughs.  Please advise   She states that pt has been doing everything that Patricia Kennedy told her to do

## 2021-08-31 NOTE — Telephone Encounter (Signed)
She was only here 2 days ago.  The antibiotics don't work that fast.  She should take her antibiotics, use her inhaler, take the cough syrup and cough pills as needed

## 2021-08-31 NOTE — Telephone Encounter (Signed)
Spoke to patient's sister and let her know what Dr Birdie Riddle advised. I let her know that if she still wasn't better at the beginning of next week to check back in

## 2021-09-19 ENCOUNTER — Telehealth: Payer: Self-pay

## 2021-09-19 NOTE — Telephone Encounter (Signed)
Please get paperwork!

## 2021-09-19 NOTE — Telephone Encounter (Signed)
Caller name:Jasmine Kraker  On DPR? :Yes  Call back number:[phone]  Provider they see: Beverely Low   Reason for call:Pt dropped off FMLA forms placed in bin

## 2021-09-27 ENCOUNTER — Telehealth: Payer: Self-pay

## 2021-09-27 DIAGNOSIS — Z0279 Encounter for issue of other medical certificate: Secondary | ICD-10-CM

## 2021-09-27 NOTE — Telephone Encounter (Signed)
Pt is late turning in FMLA please advise if this is completed and ready for pick up?

## 2021-09-27 NOTE — Telephone Encounter (Signed)
Error

## 2021-09-27 NOTE — Telephone Encounter (Signed)
Pt is calling back regarding paperwork for FMLA she is a day late turning it in and needs by this afternoon she will pick up but has to turn it in at work by 4:00

## 2021-09-27 NOTE — Telephone Encounter (Signed)
Lvm for patient letting her know the FMLA paper work was up front and ready to pick up

## 2021-09-27 NOTE — Telephone Encounter (Signed)
Form completed and placed in basket  

## 2021-09-27 NOTE — Telephone Encounter (Signed)
Pt has picked up pw and I have sent this to charge entry.   Copy placed in the bin for scan

## 2021-10-07 ENCOUNTER — Encounter: Payer: Self-pay | Admitting: Family Medicine

## 2021-10-07 ENCOUNTER — Ambulatory Visit: Payer: BC Managed Care – PPO | Admitting: Family Medicine

## 2021-10-07 VITALS — BP 122/78 | HR 97 | Temp 98.1°F | Resp 16 | Wt 159.6 lb

## 2021-10-07 DIAGNOSIS — J329 Chronic sinusitis, unspecified: Secondary | ICD-10-CM

## 2021-10-07 DIAGNOSIS — E785 Hyperlipidemia, unspecified: Secondary | ICD-10-CM | POA: Diagnosis not present

## 2021-10-07 DIAGNOSIS — B9689 Other specified bacterial agents as the cause of diseases classified elsewhere: Secondary | ICD-10-CM

## 2021-10-07 MED ORDER — GUAIFENESIN-CODEINE 100-10 MG/5ML PO SYRP: 10.0000 mL | ORAL_SOLUTION | Freq: Three times a day (TID) | ORAL | 0 refills | Status: DC | PRN

## 2021-10-07 MED ORDER — ALBUTEROL SULFATE HFA 108 (90 BASE) MCG/ACT IN AERS
2.0000 | INHALATION_SPRAY | RESPIRATORY_TRACT | 0 refills | Status: DC | PRN
Start: 2021-10-07 — End: 2022-07-27

## 2021-10-07 MED ORDER — DOXYCYCLINE HYCLATE 100 MG PO TABS: 100.0000 mg | ORAL_TABLET | Freq: Two times a day (BID) | ORAL | 0 refills | Status: DC

## 2021-10-07 NOTE — Progress Notes (Signed)
° °  Subjective:    Patient ID: Patricia Kennedy, female    DOB: Nov 26, 1965, 56 y.o.   MRN: [id number]  HPI Cough- pt reports cough started 3 days ago.  Cough is worsening.  Multiple sick contacts.  Cough is productive of white sputum.  No fevers.  Denies shortness of breath.  No wheezing.  + frontal sinus pressure.  L ear fullness.  + nasal congestion.  Hyperlipidemia- chronic problem, on Zetia 10mg  daily b/c she is unable to tolerate statins.  Pt reports she has been vomiting after taking medication.  Wants to have labs drawn to see if she needs to take any medication.   Review of Systems For ROS see HPI   This visit occurred during the SARS-CoV-2 public health emergency.  Safety protocols were in place, including screening questions prior to the visit, additional usage of staff PPE, and extensive cleaning of exam room while observing appropriate contact time as indicated for disinfecting solutions.      Objective:   Physical Exam Constitutional:      General: She is not in acute distress.    Appearance: She is well-developed.  HENT:     Head: Normocephalic and atraumatic.     Right Ear: Tympanic membrane normal.     Left Ear: Tympanic membrane normal.     Nose: Mucosal edema and rhinorrhea present.     Right Sinus: Maxillary sinus tenderness and frontal sinus tenderness present.     Left Sinus: Maxillary sinus tenderness and frontal sinus tenderness present.     Mouth/Throat:     Pharynx: Uvula midline. Posterior oropharyngeal erythema present. No oropharyngeal exudate.  Eyes:     Conjunctiva/sclera: Conjunctivae normal.     Pupils: Pupils are equal, round, and reactive to light.  Cardiovascular:     Rate and Rhythm: Normal rate and regular rhythm.     Heart sounds: Normal heart sounds.  Pulmonary:     Effort: Pulmonary effort is normal. No respiratory distress.     Breath sounds: Normal breath sounds. No wheezing.     Comments: Hacking cough Musculoskeletal:     Cervical back:  Normal range of motion and neck supple.  Lymphadenopathy:     Cervical: No cervical adenopathy.  Skin:    General: Skin is warm and dry.  Neurological:     General: No focal deficit present.     Mental Status: She is oriented to person, place, and time.     Cranial Nerves: No cranial nerve deficit.     Motor: No weakness.     Coordination: Coordination normal.  Psychiatric:        Mood and Affect: Mood normal.        Behavior: Behavior normal.        Thought Content: Thought content normal.          Assessment & Plan:  Bacterial sinusitis- new.  Pt's facial pain, congestion, + sick contacts are consistent w/ infxn.  Suspect the cough is due to copious drainage.  Start Doxycycline due to PCN allergy.  Cough meds, albuterol sent for symptomatic relief.  Pt expressed understanding and is in agreement w/ plan.

## 2021-10-07 NOTE — Patient Instructions (Signed)
Schedule a lab visit on Monday to check your cholesterol START the Doxycycline twice daily- take w/ food Use the inhaler as needed for cough/shortness of breath Use the cough syrup at night to help you sleep Use Mucinex DM or Delsym as needed for daytime cough Drink LOTS of fluids REST! Call with any questions or concerns Hang in there!!

## 2021-10-07 NOTE — Assessment & Plan Note (Signed)
Chronic problem.  Pt is already intolerant to statin but now feels that Zetia is causing her to vomit.  She has stopped the medication and the vomiting has stopped.  She will return fasting on Monday to check labs and see if medication is still needed.

## 2021-10-10 ENCOUNTER — Other Ambulatory Visit: Payer: BC Managed Care – PPO

## 2021-10-10 ENCOUNTER — Ambulatory Visit: Payer: BC Managed Care – PPO

## 2021-10-10 LAB — HEPATIC FUNCTION PANEL
ALT: 15 U/L (ref 0–35)
AST: 17 U/L (ref 0–37)
Albumin: 4.7 g/dL (ref 3.5–5.2)
Alkaline Phosphatase: 96 U/L (ref 39–117)
Bilirubin, Direct: 0.1 mg/dL (ref 0.0–0.3)
Total Bilirubin: 0.6 mg/dL (ref 0.2–1.2)
Total Protein: 6.9 g/dL (ref 6.0–8.3)

## 2021-10-10 LAB — LIPID PANEL
Cholesterol: 214 mg/dL — ABNORMAL HIGH (ref 0–200)
HDL: 67.1 mg/dL (ref >39.00)
LDL Cholesterol: 118 mg/dL — ABNORMAL HIGH (ref 0–99)
NonHDL: 147.01
Total CHOL/HDL Ratio: 3
Triglycerides: 147 mg/dL (ref 0.0–149.0)
VLDL: 29.4 mg/dL (ref 0.0–40.0)

## 2021-10-10 LAB — BASIC METABOLIC PANEL
BUN: 12 mg/dL (ref 6–23)
CO2: 31 mEq/L (ref 19–32)
Calcium: 10 mg/dL (ref 8.4–10.5)
Chloride: 101 mEq/L (ref 96–112)
Creatinine, Ser: 0.6 mg/dL (ref 0.40–1.20)
GFR: 99.16 mL/min (ref >60.00)
Glucose, Bld: 95 mg/dL (ref 70–99)
Potassium: 3.4 mEq/L — ABNORMAL LOW (ref 3.5–5.1)
Sodium: 140 mEq/L (ref 135–145)

## 2021-10-21 ENCOUNTER — Ambulatory Visit: Payer: BC Managed Care – PPO | Admitting: Family Medicine

## 2021-11-04 ENCOUNTER — Ambulatory Visit: Payer: BC Managed Care – PPO | Admitting: Registered Nurse

## 2021-11-07 ENCOUNTER — Ambulatory Visit: Payer: BC Managed Care – PPO | Admitting: Family Medicine

## 2021-11-07 ENCOUNTER — Other Ambulatory Visit: Payer: Self-pay

## 2021-11-07 ENCOUNTER — Ambulatory Visit (HOSPITAL_BASED_OUTPATIENT_CLINIC_OR_DEPARTMENT_OTHER)
Admission: RE | Admit: 2021-11-07 | Discharge: 2021-11-07 | Disposition: A | Discharge status: Home / Self Care | Payer: BC Managed Care – PPO | Source: Ambulatory Visit | Attending: Family Medicine | Admitting: Family Medicine

## 2021-11-07 ENCOUNTER — Encounter: Payer: Self-pay | Admitting: Family Medicine

## 2021-11-07 VITALS — BP 122/70 | HR 90 | Temp 98.4°F | Resp 16 | Wt 155.4 lb

## 2021-11-07 DIAGNOSIS — R300 Dysuria: Secondary | ICD-10-CM | POA: Diagnosis not present

## 2021-11-07 DIAGNOSIS — B9689 Other specified bacterial agents as the cause of diseases classified elsewhere: Secondary | ICD-10-CM | POA: Diagnosis not present

## 2021-11-07 DIAGNOSIS — R059 Cough, unspecified: Secondary | ICD-10-CM

## 2021-11-07 DIAGNOSIS — J329 Chronic sinusitis, unspecified: Secondary | ICD-10-CM | POA: Diagnosis not present

## 2021-11-07 LAB — POCT URINALYSIS DIPSTICK
Bilirubin, UA: NEGATIVE
Blood, UA: NEGATIVE
Glucose, UA: NEGATIVE
Ketones, UA: NEGATIVE
Leukocytes, UA: NEGATIVE
Nitrite, UA: NEGATIVE
Protein, UA: POSITIVE — AB
Spec Grav, UA: 1.01 (ref 1.010–1.025)
Urobilinogen, UA: 0.2 E.U./dL
pH, UA: 6 (ref 5.0–8.0)

## 2021-11-07 MED ORDER — SULFAMETHOXAZOLE-TRIMETHOPRIM 800-160 MG PO TABS: 1.0000 | ORAL_TABLET | Freq: Two times a day (BID) | ORAL | 0 refills | Status: DC

## 2021-11-07 NOTE — Progress Notes (Signed)
? ?Subjective:  ? ? Patient ID: Patricia Kennedy, female    DOB: 03-07-1966, 56 y.o.   MRN: [id number] ? ?HPI ?Dysuria- pt reports she woke up last week w/ 'bad burning'.  Sxs lasted 'all week'.  She started AZO and increased her water intake w/ some improvement.  Continues to have some burning.  + hesitancy.  + urinary frequency.  Pt reports if she tries to hold her urine, the burning worsens. ? ?URI- pt reports 2 days ago developed nasal/sinus congestion.  Again w/ a deep cough.  + post-tussive emesis.  No fever.  + sinus pressure.  'i feel it in my chest'.  Has been using her albuterol inhaler.  Had CXR in January that was 'normal'. ? ? ?Review of Systems ?For ROS see HPI  ? ?This visit occurred during the SARS-CoV-2 public health emergency.  Safety protocols were in place, including screening questions prior to the visit, additional usage of staff PPE, and extensive cleaning of exam room while observing appropriate contact time as indicated for disinfecting solutions.   ?   ?Objective:  ? Physical Exam ?Vitals reviewed.  ?Constitutional:   ?   General: She is not in acute distress. ?   Appearance: Normal appearance. She is well-developed. She is not ill-appearing.  ?HENT:  ?   Head: Normocephalic and atraumatic.  ?   Right Ear: Tympanic membrane normal.  ?   Left Ear: Tympanic membrane normal.  ?   Nose: Mucosal edema and rhinorrhea present.  ?   Right Sinus: Maxillary sinus tenderness present. No frontal sinus tenderness.  ?   Left Sinus: Maxillary sinus tenderness present. No frontal sinus tenderness.  ?   Mouth/Throat:  ?   Pharynx: Uvula midline. Posterior oropharyngeal erythema present. No oropharyngeal exudate.  ?Eyes:  ?   Conjunctiva/sclera: Conjunctivae normal.  ?   Pupils: Pupils are equal, round, and reactive to light.  ?Cardiovascular:  ?   Rate and Rhythm: Normal rate and regular rhythm.  ?   Heart sounds: Normal heart sounds.  ?Pulmonary:  ?   Effort: Pulmonary effort is normal. No respiratory  distress.  ?   Breath sounds: Normal breath sounds. No wheezing or rhonchi.  ?   Comments: + hacking cough ?Abdominal:  ?   General: Abdomen is flat.  ?   Palpations: Abdomen is soft.  ?   Tenderness: There is no abdominal tenderness. There is no guarding or rebound.  ?Musculoskeletal:  ?   Cervical back: Normal range of motion and neck supple.  ?Lymphadenopathy:  ?   Cervical: No cervical adenopathy.  ?Skin: ?   General: Skin is warm and dry.  ?Neurological:  ?   General: No focal deficit present.  ?   Mental Status: She is alert and oriented to person, place, and time.  ?   Cranial Nerves: No cranial nerve deficit.  ?   Motor: No weakness.  ?   Coordination: Coordination normal.  ?Psychiatric:     ?   Mood and Affect: Mood normal.     ?   Behavior: Behavior normal.     ?   Thought Content: Thought content normal.  ? ? ? ? ? ?   ?Assessment & Plan:  ? ?Dysuria- new.  Pt's sxs are consistent w/ UTI but UA WNL.  Will send urine for culture and start Bactrim to cover for urine, sinuses, and respiratory infxns.  Pt expressed understanding and is in agreement w/ plan.  ? ?Bacterial sinusitis- pt has  severe TTP over maxillary sinuses.  + congestion, facial pain, PND.  Start Bactrim to also cover urine and respiratory illness. ? ?Cough- ongoing and severe for pt.  Last CXR 08/26/21 WNL but she has been coughing since.  Repeat CXR and treat for bronchitis w/ Bactrim.  Reviewed supportive care and red flags that should prompt return.  Pt expressed understanding and is in agreement w/ plan.  ?

## 2021-11-07 NOTE — Patient Instructions (Signed)
Go to the [facility] on [address] to get your chest xray ?START the Trimethoprim Sulfa (Bactrim) twice daily for sinuses, lungs, and urine- take w/ food ?Drink LOTS of water ?Make sure you are taking daily allergy medication to help w/ cough and drainage ?Call with any questions or concerns ?Hang in there!!! ?

## 2021-11-09 LAB — URINE CULTURE
MICRO NUMBER:: 13156487
Result:: NO GROWTH
SPECIMEN QUALITY:: ADEQUATE

## 2021-12-24 ENCOUNTER — Other Ambulatory Visit: Payer: Self-pay | Admitting: Family Medicine

## 2021-12-26 ENCOUNTER — Other Ambulatory Visit: Payer: Self-pay

## 2021-12-26 ENCOUNTER — Telehealth: Payer: Self-pay | Admitting: Family Medicine

## 2021-12-26 DIAGNOSIS — I10 Essential (primary) hypertension: Secondary | ICD-10-CM

## 2021-12-26 MED ORDER — AMLODIPINE BESYLATE 10 MG PO TABS: 10.0000 mg | ORAL_TABLET | Freq: Every day | ORAL | 0 refills | Status: DC

## 2021-12-26 MED ORDER — VALSARTAN-HYDROCHLOROTHIAZIDE 80-12.5 MG PO TABS: 1.0000 | ORAL_TABLET | Freq: Every day | ORAL | 1 refills | Status: DC

## 2021-12-26 NOTE — Telephone Encounter (Signed)
Valsartan hydrochlorothiazide and amlodipine needs to be sent to [facility] on [address] in [place]  ? ?Please advise next appt is 04/10/22 with Dr. Beverely Low  ?

## 2021-12-26 NOTE — Telephone Encounter (Signed)
Medications have been sent to pharmacy 

## 2022-01-30 ENCOUNTER — Ambulatory Visit (INDEPENDENT_AMBULATORY_CARE_PROVIDER_SITE_OTHER): Payer: BC Managed Care – PPO | Admitting: Family Medicine

## 2022-01-30 ENCOUNTER — Encounter: Payer: Self-pay | Admitting: Family Medicine

## 2022-01-30 VITALS — BP 114/80 | HR 94 | Temp 97.8°F | Resp 16 | Ht 61.0 in | Wt 155.0 lb

## 2022-01-30 DIAGNOSIS — M7989 Other specified soft tissue disorders: Secondary | ICD-10-CM | POA: Diagnosis not present

## 2022-01-30 DIAGNOSIS — R5383 Other fatigue: Secondary | ICD-10-CM | POA: Diagnosis not present

## 2022-01-30 DIAGNOSIS — K219 Gastro-esophageal reflux disease without esophagitis: Secondary | ICD-10-CM | POA: Diagnosis not present

## 2022-01-30 DIAGNOSIS — M79642 Pain in left hand: Secondary | ICD-10-CM

## 2022-01-30 DIAGNOSIS — M79641 Pain in right hand: Secondary | ICD-10-CM | POA: Diagnosis not present

## 2022-01-30 DIAGNOSIS — Z1331 Encounter for screening for depression: Secondary | ICD-10-CM

## 2022-01-30 LAB — B12 AND FOLATE PANEL
Folate: 14.6 ng/mL (ref >5.9)
Vitamin B-12: 1126 pg/mL — ABNORMAL HIGH (ref 211–911)

## 2022-01-30 LAB — CBC WITH DIFFERENTIAL/PLATELET
Basophils Absolute: 0 10*3/uL (ref 0.0–0.1)
Basophils Relative: 0.8 % (ref 0.0–3.0)
Eosinophils Absolute: 0.2 10*3/uL (ref 0.0–0.7)
Eosinophils Relative: 2.7 % (ref 0.0–5.0)
HCT: 42.4 % (ref 36.0–46.0)
Hemoglobin: 14.8 g/dL (ref 12.0–15.0)
Lymphocytes Relative: 30.5 % (ref 12.0–46.0)
Lymphs Abs: 1.7 10*3/uL (ref 0.7–4.0)
MCHC: 34.8 g/dL (ref 30.0–36.0)
MCV: 91.6 fl (ref 78.0–100.0)
Monocytes Absolute: 0.5 10*3/uL (ref 0.1–1.0)
Monocytes Relative: 9.4 % (ref 3.0–12.0)
Neutro Abs: 3.2 10*3/uL (ref 1.4–7.7)
Neutrophils Relative %: 56.6 % (ref 43.0–77.0)
Platelets: 201 10*3/uL (ref 150.0–400.0)
RBC: 4.63 Mil/uL (ref 3.87–5.11)
RDW: 13.1 % (ref 11.5–15.5)
WBC: 5.7 10*3/uL (ref 4.0–10.5)

## 2022-01-30 LAB — TSH: TSH: 1.42 u[IU]/mL (ref 0.35–5.50)

## 2022-01-30 LAB — BASIC METABOLIC PANEL
BUN: 12 mg/dL (ref 6–23)
CO2: 26 mEq/L (ref 19–32)
Calcium: 9.3 mg/dL (ref 8.4–10.5)
Chloride: 106 mEq/L (ref 96–112)
Creatinine, Ser: 0.56 mg/dL (ref 0.40–1.20)
GFR: 100.6 mL/min (ref >60.00)
Glucose, Bld: 91 mg/dL (ref 70–99)
Potassium: 3.3 mEq/L — ABNORMAL LOW (ref 3.5–5.1)
Sodium: 141 mEq/L (ref 135–145)

## 2022-01-30 LAB — HEPATIC FUNCTION PANEL
ALT: 15 U/L (ref 0–35)
AST: 15 U/L (ref 0–37)
Albumin: 4.2 g/dL (ref 3.5–5.2)
Alkaline Phosphatase: 85 U/L (ref 39–117)
Bilirubin, Direct: 0.1 mg/dL (ref 0.0–0.3)
Total Bilirubin: 0.4 mg/dL (ref 0.2–1.2)
Total Protein: 6.6 g/dL (ref 6.0–8.3)

## 2022-01-30 LAB — VITAMIN D 25 HYDROXY (VIT D DEFICIENCY, FRACTURES): VITD: 54.23 ng/mL (ref 30.00–100.00)

## 2022-01-30 MED ORDER — OMEPRAZOLE 20 MG PO CPDR: 20.0000 mg | DELAYED_RELEASE_CAPSULE | Freq: Every day | ORAL | 1 refills | Status: DC

## 2022-01-30 MED ORDER — PREDNISONE 10 MG PO TABS: ORAL_TABLET | ORAL | 0 refills | Status: DC

## 2022-01-30 NOTE — Patient Instructions (Signed)
Follow up as needed or as scheduled We'll notify you of your lab results and make any changes if needed START the Predisone as directed- 3 pills at the same time x3 days and then 2 pills at the same time x3 days and then 1 pill daily.  Take w/ food Continue to ice your hands START the Omeprazole once daily- every day- to help your burning stomach Call with any questions or concerns Hang in there!!!

## 2022-01-30 NOTE — Progress Notes (Signed)
   Subjective:    Patient ID: Patricia Kennedy, female    DOB: [birth year], 56 y.o.   MRN: [id number]  HPI Swelling in hands- pt reports both hands are very painful.  Sxs started ~2 weeks ago.  Now has swelling and nodularity of hands and fingers.  Sister has RA.  Pt reports no hx of similar hand/joint pain.  Pain is described as throbbing.  Hands will swell to the point of being tight and uncomfortable.  + fatigue.  Burning stomach- pt has a medication (Prilosec) that she takes 'now and then' as needed for pain.  Yesterday had increased sxs w/ associated nausea.  Pt reports sxs improved after Prilosec  + depression score- pt reports she does feel sad and down but is not interested in medication   Review of Systems For ROS see HPI     Objective:   Physical Exam Vitals reviewed.  Constitutional:      General: She is not in acute distress.    Appearance: Normal appearance. She is well-developed. She is not ill-appearing.  HENT:     Head: Normocephalic and atraumatic.  Eyes:     Conjunctiva/sclera: Conjunctivae normal.     Pupils: Pupils are equal, round, and reactive to light.  Neck:     Thyroid: No thyromegaly.  Cardiovascular:     Rate and Rhythm: Normal rate and regular rhythm.     Heart sounds: Normal heart sounds. No murmur heard. Pulmonary:     Effort: Pulmonary effort is normal. No respiratory distress.     Breath sounds: Normal breath sounds.  Abdominal:     General: There is no distension.     Palpations: Abdomen is soft.     Tenderness: There is no abdominal tenderness. There is no guarding or rebound.  Musculoskeletal:        General: Swelling (of PIP and DIP joints bilaterally) and tenderness (of DIP and PIP joints bilaterally) present.     Cervical back: Normal range of motion and neck supple.  Lymphadenopathy:     Cervical: No cervical adenopathy.  Skin:    General: Skin is warm and dry.  Neurological:     General: No focal deficit present.     Mental Status: She  is alert and oriented to person, place, and time.  Psychiatric:        Mood and Affect: Mood normal.        Behavior: Behavior normal.           Assessment & Plan:  Pain and swelling in hands bilaterally- new.  Pt has a job that involves constant repetitive motion of hands.  Sister has RA.  Will check labs to assess for autoimmune process and start Prednisone taper for pain relief.  Pt expressed understanding and is in agreement w/ plan.   Fatigue- new.  May be related to pain and swelling but may be related to + depression screen.  Will check labs to assess for underlying cause and treat if needed.  Pt expressed understanding and is in agreement w/ plan.   + depression screen- pt not interested in medication at this time.  Will continue to follow.

## 2022-02-01 LAB — RHEUMATOID FACTOR: Rheumatoid fact SerPl-aCnc: <14 [IU]/mL (ref <14)

## 2022-02-01 LAB — ANA: Anti Nuclear Antibody (ANA): NEGATIVE

## 2022-02-02 ENCOUNTER — Telehealth: Payer: Self-pay

## 2022-02-02 NOTE — Telephone Encounter (Signed)
Spoke w/ pt and advised of lab results  

## 2022-02-02 NOTE — Telephone Encounter (Signed)
-----   Message from Sheliah Hatch, MD sent at 02/02/2022  7:28 AM EDT ----- No evidence of autoimmune process or rheumatoid arthritis at this time.  Potassium is mildly low but this will improve w/ increased leafy greens, bananas, citrus fruits, and daily multivitamin.  Remainder of labs look good!

## 2022-02-07 NOTE — Progress Notes (Unsigned)
This encounter was created in error - please disregard.

## 2022-02-14 ENCOUNTER — Ambulatory Visit: Payer: BC Managed Care – PPO | Admitting: Family Medicine

## 2022-02-15 ENCOUNTER — Ambulatory Visit: Payer: BC Managed Care – PPO | Admitting: Family Medicine

## 2022-02-15 ENCOUNTER — Encounter: Payer: Self-pay | Admitting: Family Medicine

## 2022-02-15 VITALS — BP 118/78 | HR 95 | Temp 99.1°F | Resp 16 | Ht 61.0 in | Wt 156.0 lb

## 2022-02-15 DIAGNOSIS — R6881 Early satiety: Secondary | ICD-10-CM | POA: Diagnosis not present

## 2022-02-15 DIAGNOSIS — R051 Acute cough: Secondary | ICD-10-CM | POA: Diagnosis not present

## 2022-02-15 DIAGNOSIS — J0100 Acute maxillary sinusitis, unspecified: Secondary | ICD-10-CM

## 2022-02-15 DIAGNOSIS — R112 Nausea with vomiting, unspecified: Secondary | ICD-10-CM

## 2022-02-15 DIAGNOSIS — F17200 Nicotine dependence, unspecified, uncomplicated: Secondary | ICD-10-CM | POA: Diagnosis not present

## 2022-02-15 MED ORDER — ONDANSETRON HCL 4 MG PO TABS: 4.0000 mg | ORAL_TABLET | Freq: Three times a day (TID) | ORAL | 0 refills | Status: DC | PRN

## 2022-02-15 MED ORDER — PROMETHAZINE-DM 6.25-15 MG/5ML PO SYRP: 5.0000 mL | ORAL_SOLUTION | Freq: Four times a day (QID) | ORAL | 0 refills | Status: DC | PRN

## 2022-02-15 MED ORDER — DOXYCYCLINE HYCLATE 100 MG PO TABS: 100.0000 mg | ORAL_TABLET | Freq: Two times a day (BID) | ORAL | 0 refills | Status: DC

## 2022-02-15 NOTE — Progress Notes (Signed)
Subjective:    Patient ID: Patricia Kennedy, female    DOB: February 23, 1966, 56 y.o.   MRN: [id number]  HPI Cough- pt reports cough, 'very weak'.  Will cough until she vomits.  Cough is productive of green sputum.  Pt has been at home x5 days.  Sxs started ~2 weeks ago but have worsened.  Pt is a heavy smoker.  No fevers.  + maxillary sinus pain.  Bilateral ear pressure.    Smoking- pt is aware she needs to quit smoking as she is having repeated respiratory infxns.  Would like to try Chantix  Pt feels that food is getting stuck.  Reports food will accumulate and then when she vomited, she threw up everything she ate that day.  Currently on Omeprazole.  Reports feeling full all the time.   Review of Systems For ROS see HPI     Objective:   Physical Exam Vitals reviewed.  Constitutional:      General: She is not in acute distress.    Appearance: Normal appearance. She is well-developed. She is not ill-appearing.  HENT:     Head: Normocephalic and atraumatic.     Right Ear: Tympanic membrane normal.     Left Ear: Tympanic membrane normal.     Nose: Mucosal edema and rhinorrhea present.     Right Sinus: Maxillary sinus tenderness present. No frontal sinus tenderness.     Left Sinus: Maxillary sinus tenderness present. No frontal sinus tenderness.     Mouth/Throat:     Pharynx: Uvula midline. Posterior oropharyngeal erythema present. No oropharyngeal exudate.  Eyes:     Conjunctiva/sclera: Conjunctivae normal.     Pupils: Pupils are equal, round, and reactive to light.  Cardiovascular:     Rate and Rhythm: Normal rate and regular rhythm.     Heart sounds: Normal heart sounds.  Pulmonary:     Effort: Pulmonary effort is normal. No respiratory distress.     Breath sounds: Normal breath sounds. No wheezing.  Abdominal:     General: There is no distension.     Palpations: Abdomen is soft. There is no mass.     Tenderness: There is no abdominal tenderness. There is no rebound.   Musculoskeletal:     Cervical back: Normal range of motion and neck supple.  Lymphadenopathy:     Cervical: No cervical adenopathy.  Skin:    General: Skin is warm and dry.  Neurological:     General: No focal deficit present.     Mental Status: She is alert and oriented to person, place, and time.     Cranial Nerves: No cranial nerve deficit.     Motor: No weakness.     Coordination: Coordination normal.  Psychiatric:        Mood and Affect: Mood normal.        Behavior: Behavior normal.        Thought Content: Thought content normal.           Assessment & Plan:  Maxillary sinusitis- new.  Pt's sxs and duration of illness consistent w/ bacterial infxn.  Start Doxycycline due to PCN allergy.  Pt expressed understanding and is in agreement w/ plan.   Cough- ongoing issue for pt.  She is a heavy smoker and every time she develops a viral or respiratory illness, she develops an intense, hacking cough.  Thankfully lungs are CTAB today.  Will tx sinusitis which is likely contributing to cough.  Doxycycline will also tx any respiratory infxn  if present.  Encouraged fluids, rest, cough medication.  Reviewed supportive care and red flags that should prompt return.  Pt expressed understanding and is in agreement w/ plan.   Smoking- ongoing problem for pt.  She wants to know if Chantix would be helpful.  I told her it has the best results of the available smoking cessation products but she would need to know if this is covered by insurance.  Told her we would also want to start after she has completed her abx and is feeling better from her current illness.  Early satiety/vomiting- new.  Pt reports she will vomit an entire day's worth of undigested food after coughing.  She reports she always feels full and bloated.  Would like an EGD.  Will refer to GI for complete evaluation and tx.  Pt expressed understanding and is in agreement w/ plan.

## 2022-02-15 NOTE — Patient Instructions (Addendum)
Follow up as needed or as scheduled START the Doxycycline twice daily to treat infection in your sinuses and lungs.  Take w/ food Drink LOTS of water USE the cough medicine as needed.  This should also help w/ nausea TAKE the nausea pill if the cough syrup isn't helping We'll call you with your GI referral about the vomiting undigested food Check with your insurance company to see if the Chantix (Varenicline) is covered.  Let me know Call with any questions or concerns Hang in there!

## 2022-03-06 ENCOUNTER — Encounter: Payer: Self-pay | Admitting: Family

## 2022-03-06 ENCOUNTER — Ambulatory Visit: Payer: BC Managed Care – PPO | Admitting: Family

## 2022-03-06 VITALS — BP 142/81 | HR 88 | Temp 98.6°F | Ht 61.0 in | Wt 157.2 lb

## 2022-03-06 DIAGNOSIS — I10 Essential (primary) hypertension: Secondary | ICD-10-CM

## 2022-03-06 DIAGNOSIS — R55 Syncope and collapse: Secondary | ICD-10-CM | POA: Diagnosis not present

## 2022-03-06 MED ORDER — VALSARTAN 80 MG PO TABS: 80.0000 mg | ORAL_TABLET | Freq: Two times a day (BID) | ORAL | 2 refills | Status: DC

## 2022-03-06 MED ORDER — VALSARTAN 80 MG PO TABS: 80.0000 mg | ORAL_TABLET | ORAL | 2 refills | Status: DC

## 2022-03-06 NOTE — Patient Instructions (Addendum)
It was very nice to see you today!  I have sent Valsartan (without HCTZ) to your pharmacy. Take 1 pill right after work in the morning, and then 2nd pill at 1:00pm.  Continue to check your blood pressure at home and let Dr. Beverely Low know if it is still too high or running to low - below 100/60.  Eat low sodium diet and drink at least 2 liters of water daily!     PLEASE NOTE:  If you had any lab tests please let us know if you have not heard back within a few days. You may see your results on MyChart before we have a chance to review them but we will give you a call once they are reviewed by [place]. If we ordered any referrals today, please let us know if you have not heard from their office within the next week.

## 2022-03-06 NOTE — Progress Notes (Signed)
Patient ID: Patricia Kennedy, female    DOB: 06/10/1966, 56 y.o.   MRN: [id number]  Chief Complaint  Patient presents with   Hospitalization Follow-up    Pt was seen in ED on 7/13, Pt went to her eye doctor's office and they dilated her eyes and she said she does not remember anything after her eyes were dilated. She states this is the second time this happened to her. They called EMS and when they arrived they stated that the patient was alert and oriented but when they walked her to the stretcher she was a little unsteady. Pt states she has been fine since.    HPI: ER visit f/u:  pt seen at eye doctor for a cataract eval; eye drops instilled to dilate her eyes, and subsequently had a syncopal episode and sent to ER. BP at eye doctor 94/60, HR 69. Pt reports only other syncopal episode was after receiving Covid vaccine. She has been told she was dehydrated in past, BP in ER good, K+ low, pt denies any other episodes since home. Works 3rd shift, 12h, reports she does not drink water at work d/t no time to go to [employer].   Assessment & Plan:   Problem List Items Addressed This Visit       Cardiovascular and Mediastinum   Essential hypertension - Primary c/o of head fullness/congestion/pressure behind eys, recent syncopal episode, hypokalemia, pt admits to poor water intake, reports elevated BP readings at home 160s/100s, today slightly elevated;  advised to stop Valsartan-HCTZ, sending Valsartan 80mg  bid, advised to monitor BP over the next week and notify PCP of abnormal readings.    Relevant Medications   valsartan (DIOVAN) 80 MG tablet   Other Visit Diagnoses     Syncope, unspecified syncope type    - occurred at eye doctor for cataract eval, soon after eye drops instilled, sent to ER, hydrated, BP good, K+ low, advised to f/u with primary. Denies any other episodes since home. Advised on proper water hydration daily and not skipping meals. Stopping HCTZ, f/u with PCP./      Subjective:     Outpatient Medications Prior to Visit  Medication Sig Dispense Refill   albuterol (VENTOLIN HFA) 108 (90 Base) MCG/ACT inhaler Inhale 2 puffs into the lungs every 4 (four) hours as needed for wheezing or shortness of breath. 8 g 0   amLODipine (NORVASC) 10 MG tablet Take 1 tablet (10 mg total) by mouth daily. 90 tablet 0   cetirizine (ZYRTEC) 10 MG tablet Take 10 mg by mouth daily.     cholecalciferol (VITAMIN D3) 25 MCG (1000 UNIT) tablet Take 1,000 Units by mouth daily.     ezetimibe (ZETIA) 10 MG tablet Take 1 tablet (10 mg total) by mouth daily. 90 tablet 3   fluticasone (FLONASE) 50 MCG/ACT nasal spray Place 2 sprays into both nostrils daily. 16 g 6   Turmeric (QC TUMERIC COMPLEX) 500 MG CAPS Take by mouth.     vitamin B-12 (CYANOCOBALAMIN) 100 MCG tablet Take 100 mcg by mouth daily.     Vitamin D, Ergocalciferol, (DRISDOL) 1.25 MG (50000 UNIT) CAPS capsule Take 1 capsule (50,000 Units total) by mouth every 7 (seven) days. 12 capsule 0   vitamin k 100 MCG tablet Take 100 mcg by mouth daily.     doxycycline (VIBRA-TABS) 100 MG tablet Take 1 tablet (100 mg total) by mouth 2 (two) times daily. 20 tablet 0   ondansetron (ZOFRAN) 4 MG tablet Take 1 tablet (4  mg total) by mouth every 8 (eight) hours as needed for nausea or vomiting. 20 tablet 0   predniSONE (DELTASONE) 10 MG tablet 3 tabs x3 days and then 2 tabs x3 days and then 1 tab x3 days.  Take w/ food. 18 tablet 0   promethazine-dextromethorphan (PROMETHAZINE-DM) 6.25-15 MG/5ML syrup Take 5 mLs by mouth 4 (four) times daily as needed. 240 mL 0   valsartan-hydrochlorothiazide (DIOVAN-HCT) 80-12.5 MG tablet Take 1 tablet by mouth daily. 90 tablet 1   omeprazole (PRILOSEC) 20 MG capsule Take 1 capsule (20 mg total) by mouth daily. (Patient not taking: Reported on 03/06/2022) 90 capsule 1   No facility-administered medications prior to visit.   Past Medical History:  Diagnosis Date   Dysuria 02/11/2016   Essential hypertension 02/11/2016    GERD (gastroesophageal reflux disease) 02/20/2016   Hypertension    Leg pain, right 02/11/2016   Past Surgical History:  Procedure Laterality Date   HYSTEROSCOPY     and D and C for incomplete miscarriage   Allergies  Allergen Reactions   Penicillins    Statins     Weakness, aches with atorvastatin      Objective:    Physical Exam Vitals and nursing note reviewed.  Constitutional:      Appearance: Normal appearance.  Cardiovascular:     Rate and Rhythm: Normal rate and regular rhythm.  Pulmonary:     Effort: Pulmonary effort is normal.     Breath sounds: Normal breath sounds.  Musculoskeletal:        General: Normal range of motion.  Skin:    General: Skin is warm and dry.  Neurological:     Mental Status: She is alert.  Psychiatric:        Mood and Affect: Mood normal.        Behavior: Behavior normal.   BP (!) 142/81 (BP Location: Left Arm, Patient Position: Sitting, Cuff Size: Large)   Pulse 88   Temp 98.6 F (37 C) (Temporal)   Ht 5\' 1"  (1.549 m)   Wt 157 lb 4 oz (71.3 kg)   SpO2 98%   BMI 29.71 kg/m  Wt Readings from Last 3 Encounters:  03/06/22 157 lb 4 oz (71.3 kg)  02/15/22 156 lb (70.8 kg)  01/30/22 155 lb (70.3 kg)       04/01/22, NP

## 2022-04-10 ENCOUNTER — Ambulatory Visit: Payer: BC Managed Care – PPO | Admitting: Family Medicine

## 2022-04-17 ENCOUNTER — Encounter: Payer: Self-pay | Admitting: Family Medicine

## 2022-04-17 ENCOUNTER — Ambulatory Visit: Payer: BC Managed Care – PPO | Admitting: Family Medicine

## 2022-04-17 ENCOUNTER — Ambulatory Visit (INDEPENDENT_AMBULATORY_CARE_PROVIDER_SITE_OTHER): Payer: BC Managed Care – PPO | Admitting: Family Medicine

## 2022-04-17 VITALS — BP 112/68 | HR 89 | Temp 98.2°F | Resp 16 | Ht 61.0 in | Wt 156.4 lb

## 2022-04-17 DIAGNOSIS — R5383 Other fatigue: Secondary | ICD-10-CM | POA: Diagnosis not present

## 2022-04-17 DIAGNOSIS — I10 Essential (primary) hypertension: Secondary | ICD-10-CM | POA: Diagnosis not present

## 2022-04-17 DIAGNOSIS — R55 Syncope and collapse: Secondary | ICD-10-CM | POA: Insufficient documentation

## 2022-04-17 DIAGNOSIS — E785 Hyperlipidemia, unspecified: Secondary | ICD-10-CM | POA: Diagnosis not present

## 2022-04-17 DIAGNOSIS — F32A Depression, unspecified: Secondary | ICD-10-CM | POA: Insufficient documentation

## 2022-04-17 LAB — CBC WITH DIFFERENTIAL/PLATELET
Basophils Absolute: 0 10*3/uL (ref 0.0–0.1)
Basophils Relative: 0.6 % (ref 0.0–3.0)
Eosinophils Absolute: 0.1 10*3/uL (ref 0.0–0.7)
Eosinophils Relative: 1.8 % (ref 0.0–5.0)
HCT: 43.2 % (ref 36.0–46.0)
Hemoglobin: 14.8 g/dL (ref 12.0–15.0)
Lymphocytes Relative: 33 % (ref 12.0–46.0)
Lymphs Abs: 2 10*3/uL (ref 0.7–4.0)
MCHC: 34.3 g/dL (ref 30.0–36.0)
MCV: 91.5 fl (ref 78.0–100.0)
Monocytes Absolute: 0.4 10*3/uL (ref 0.1–1.0)
Monocytes Relative: 7.2 % (ref 3.0–12.0)
Neutro Abs: 3.6 10*3/uL (ref 1.4–7.7)
Neutrophils Relative %: 57.4 % (ref 43.0–77.0)
Platelets: 191 10*3/uL (ref 150.0–400.0)
RBC: 4.72 Mil/uL (ref 3.87–5.11)
RDW: 12.5 % (ref 11.5–15.5)
WBC: 6.2 10*3/uL (ref 4.0–10.5)

## 2022-04-17 LAB — LIPID PANEL
Cholesterol: 198 mg/dL (ref 0–200)
HDL: 54.1 mg/dL (ref >39.00)
LDL Cholesterol: 117 mg/dL — ABNORMAL HIGH (ref 0–99)
NonHDL: 144.25
Total CHOL/HDL Ratio: 4
Triglycerides: 136 mg/dL (ref 0.0–149.0)
VLDL: 27.2 mg/dL (ref 0.0–40.0)

## 2022-04-17 LAB — BASIC METABOLIC PANEL
BUN: 12 mg/dL (ref 6–23)
CO2: 26 mEq/L (ref 19–32)
Calcium: 9 mg/dL (ref 8.4–10.5)
Chloride: 106 mEq/L (ref 96–112)
Creatinine, Ser: 0.53 mg/dL (ref 0.40–1.20)
GFR: 101.79 mL/min (ref >60.00)
Glucose, Bld: 88 mg/dL (ref 70–99)
Potassium: 3.4 mEq/L — ABNORMAL LOW (ref 3.5–5.1)
Sodium: 140 mEq/L (ref 135–145)

## 2022-04-17 LAB — HEPATIC FUNCTION PANEL
ALT: 14 U/L (ref 0–35)
AST: 15 U/L (ref 0–37)
Albumin: 4.3 g/dL (ref 3.5–5.2)
Alkaline Phosphatase: 84 U/L (ref 39–117)
Bilirubin, Direct: 0.1 mg/dL (ref 0.0–0.3)
Total Bilirubin: 0.3 mg/dL (ref 0.2–1.2)
Total Protein: 6.4 g/dL (ref 6.0–8.3)

## 2022-04-17 LAB — B12 AND FOLATE PANEL
Folate: 15.2 ng/mL (ref >5.9)
Vitamin B-12: 943 pg/mL — ABNORMAL HIGH (ref 211–911)

## 2022-04-17 LAB — TSH: TSH: 2.31 u[IU]/mL (ref 0.35–5.50)

## 2022-04-17 LAB — VITAMIN D 25 HYDROXY (VIT D DEFICIENCY, FRACTURES): VITD: 42.69 ng/mL (ref 30.00–100.00)

## 2022-04-17 MED ORDER — DULOXETINE HCL 20 MG PO CPEP: 20.0000 mg | ORAL_CAPSULE | Freq: Every day | ORAL | 3 refills | Status: DC

## 2022-04-17 MED ORDER — EZETIMIBE 10 MG PO TABS: 10.0000 mg | ORAL_TABLET | Freq: Every day | ORAL | 1 refills | Status: DC

## 2022-04-17 MED ORDER — AMLODIPINE BESYLATE 10 MG PO TABS: 10.0000 mg | ORAL_TABLET | Freq: Every day | ORAL | 0 refills | Status: DC

## 2022-04-17 MED ORDER — VALSARTAN 80 MG PO TABS: 80.0000 mg | ORAL_TABLET | Freq: Two times a day (BID) | ORAL | 1 refills | Status: DC

## 2022-04-17 NOTE — Progress Notes (Signed)
   Subjective:    Patient ID: Patricia Kennedy, female    DOB: Aug 11, 1966, 55 y.o.   MRN: [id number]  HPI HTN- chronic problem, on Amlodipine 10mg  daily, Valsartan 80mg  daily w/ good control.  Denies CP, SOB.  Hyperlipidemia- chronic problem, on Zetia 10mg  daily.  No abd pain, N/V.  Bad mood- pt reports she is in a 'bad mood' all the time.  Feels weak, 'tired all the time'.  Pt reports sore muscles 'all the time'.  Pt reports she is sad, overwhelmed.  Feels this is all since having her COVID shot  Syncope- pt was at the eye doctor when she passed out.  Was taken to ER on 7/13.  CT head, CXR, EKG, labs were unremarkable.  Did have mildly low K+.     Review of Systems For ROS see HPI     Objective:   Physical Exam Vitals reviewed.  Constitutional:      General: She is not in acute distress.    Appearance: Normal appearance. She is well-developed. She is not ill-appearing.  HENT:     Head: Normocephalic and atraumatic.  Eyes:     Conjunctiva/sclera: Conjunctivae normal.     Pupils: Pupils are equal, round, and reactive to light.  Neck:     Thyroid: No thyromegaly.  Cardiovascular:     Rate and Rhythm: Normal rate and regular rhythm.     Heart sounds: Normal heart sounds. No murmur heard. Pulmonary:     Effort: Pulmonary effort is normal. No respiratory distress.     Breath sounds: Normal breath sounds.  Abdominal:     General: There is no distension.     Palpations: Abdomen is soft.     Tenderness: There is no abdominal tenderness.  Musculoskeletal:     Cervical back: Normal range of motion and neck supple.     Right lower leg: No edema.     Left lower leg: No edema.  Lymphadenopathy:     Cervical: No cervical adenopathy.  Skin:    General: Skin is warm and dry.  Neurological:     General: No focal deficit present.     Mental Status: She is alert and oriented to person, place, and time.  Psychiatric:        Mood and Affect: Mood normal.        Behavior: Behavior normal.            Assessment & Plan:  Fatigue- likely multifactorial.  Will check labs to assess for underlying metabolic cause.  Will start Cymbalta 20mg  for mood, pain, and sleep (likely fibromyalgia).  Will follow.

## 2022-04-17 NOTE — Assessment & Plan Note (Signed)
Chronic problem.  Currently on Amlodipine 10mg  daily and Valsartan 80mg  BID w/ good control.  Currently asymptomatic.  Check labs due to ARB but no anticipated med changes.

## 2022-04-17 NOTE — Assessment & Plan Note (Signed)
New.  Pt reports she is irritable, 'in a bad mood all the time'.  Feels fatigued, has poor sleep, has sore muscles and joints 'all the time'.  Her sxs sound consistent w/ fibromyalgia.  Initially she is very resistant to the idea of medication- despite admitting she is sad and overwhelmed.  When I discussed that some medications also have indications for pain, she was more open to the idea.  Start Cymbalta 20mg  daily and monitor for improvement.  Pt expressed understanding and is in agreement w/ plan.

## 2022-04-17 NOTE — Assessment & Plan Note (Signed)
New to provider.  Pt had episode at eye doctor on 7/13 after having her eyes dilated.  Unclear if this was a vagal episode due to dilating medication, dehydration, or both.  ER w/u was reassuring although they told her she was dehydrated.  Encouraged pt to increase water intake and change positions slowly.  Will follow.

## 2022-04-17 NOTE — Patient Instructions (Addendum)
Follow up in 4-6 weeks to recheck mood and pain Schedule your complete physical in 6 months We'll notify you of your lab results and make any changes if needed Make sure you are eating regularly and drinking plenty of fluids START the Duloxetine (Cymbalta) to help w/ mood and pain Call with any questions or concerns Hang in there!!!

## 2022-04-17 NOTE — Assessment & Plan Note (Signed)
Chronic problem.  Currently on Zetia 10mg daily w/o difficulty.  Check labs.  Adjust meds prn  

## 2022-04-18 NOTE — Progress Notes (Signed)
Left pt a vm in regards to her lab results  

## 2022-05-14 NOTE — Assessment & Plan Note (Signed)
Deteriorated.  Pt's sxs improve w/ Prilosec but she is not taking this regularly.  Stressed the importance of daily use and reviewed dietary and lifestyle modifications to improve sxs.  Prescription for daily Omeprazole 20mg  sent to pharmacy

## 2022-05-16 ENCOUNTER — Encounter: Payer: Self-pay | Admitting: Internal Medicine

## 2022-05-22 ENCOUNTER — Ambulatory Visit: Payer: BC Managed Care – PPO | Admitting: Family Medicine

## 2022-06-23 ENCOUNTER — Encounter: Payer: Self-pay | Admitting: Internal Medicine

## 2022-06-23 ENCOUNTER — Ambulatory Visit (INDEPENDENT_AMBULATORY_CARE_PROVIDER_SITE_OTHER): Payer: BC Managed Care – PPO | Admitting: Internal Medicine

## 2022-06-23 VITALS — BP 130/72 | HR 83 | Ht 61.0 in | Wt 159.2 lb

## 2022-06-23 DIAGNOSIS — K219 Gastro-esophageal reflux disease without esophagitis: Secondary | ICD-10-CM

## 2022-06-23 DIAGNOSIS — R112 Nausea with vomiting, unspecified: Secondary | ICD-10-CM

## 2022-06-23 DIAGNOSIS — R1013 Epigastric pain: Secondary | ICD-10-CM

## 2022-06-23 DIAGNOSIS — R6881 Early satiety: Secondary | ICD-10-CM

## 2022-06-23 NOTE — Progress Notes (Signed)
Chief Complaint: N&V, early satiety  HPI : 56 year old female with history of GERD and HTN presents for N&V and early satiety  She will eat and then feels burning in her chest. Endorses some ab pain in the epigastric ab and lower abdomen. She is avoiding heavy foods for now, which has helped reduced N&V. She doesn't take anything for GERD at this time. Denies dysphagia or odynophagia. Denies melena, hematochezia, unintentional weight loss, diarrhea, and constipation. Denies family history of GI issues. Patient works night shifts with [employer].  Wt Readings from Last 3 Encounters:  06/23/22 159 lb 4 oz (72.2 kg)  04/17/22 156 lb 6 oz (70.9 kg)  03/06/22 157 lb 4 oz (71.3 kg)   Past Medical History:  Diagnosis Date   Dysuria 02/11/2016   Essential hypertension 02/11/2016   GERD (gastroesophageal reflux disease) 02/20/2016   Hypertension    Leg pain, right 02/11/2016     Past Surgical History:  Procedure Laterality Date   HYSTEROSCOPY     and D and C for incomplete miscarriage   Family History  Problem Relation Age of Onset   Heart disease Mother    Hypertension Mother    Stroke Mother    Heart disease Father    Hypertension Father    Heart disease Sister        palpitations   Heart disease Brother    Jaundice Maternal Grandmother        liver disease   GER disease Sister    Heart disease Brother    Heart disease Daughter        MI   Social History   Tobacco Use   Smoking status: Every Day   Smokeless tobacco: Never  Substance Use Topics   Alcohol use: No   Drug use: No   Current Outpatient Medications  Medication Sig Dispense Refill   amLODipine (NORVASC) 10 MG tablet Take 1 tablet (10 mg total) by mouth daily. 90 tablet 0   DULoxetine (CYMBALTA) 20 MG capsule Take 1 capsule (20 mg total) by mouth daily. 30 capsule 3   valsartan (DIOVAN) 80 MG tablet Take 1 tablet (80 mg total) by mouth 2 (two) times daily. 180 tablet 1   vitamin k 100 MCG tablet Take 100  mcg by mouth daily.     albuterol (VENTOLIN HFA) 108 (90 Base) MCG/ACT inhaler Inhale 2 puffs into the lungs every 4 (four) hours as needed for wheezing or shortness of breath. (Patient not taking: Reported on 06/23/2022) 8 g 0   cetirizine (ZYRTEC) 10 MG tablet Take 10 mg by mouth daily. (Patient not taking: Reported on 06/23/2022)     cholecalciferol (VITAMIN D3) 25 MCG (1000 UNIT) tablet Take 1,000 Units by mouth daily. (Patient not taking: Reported on 06/23/2022)     ezetimibe (ZETIA) 10 MG tablet Take 1 tablet (10 mg total) by mouth daily. (Patient not taking: Reported on 06/23/2022) 90 tablet 1   fluticasone (FLONASE) 50 MCG/ACT nasal spray Place 2 sprays into both nostrils daily. (Patient not taking: Reported on 06/23/2022) 16 g 6   omeprazole (PRILOSEC) 20 MG capsule Take 1 capsule (20 mg total) by mouth daily. (Patient not taking: Reported on 06/23/2022) 90 capsule 1   Turmeric (QC TUMERIC COMPLEX) 500 MG CAPS Take by mouth. (Patient not taking: Reported on 06/23/2022)     vitamin B-12 (CYANOCOBALAMIN) 100 MCG tablet Take 100 mcg by mouth daily. (Patient not taking: Reported on 06/23/2022)     Vitamin D,  Ergocalciferol, (DRISDOL) 1.25 MG (50000 UNIT) CAPS capsule Take 1 capsule (50,000 Units total) by mouth every 7 (seven) days. (Patient not taking: Reported on 06/23/2022) 12 capsule 0   No current facility-administered medications for this visit.   Allergies  Allergen Reactions   Penicillins    Statins     Weakness, aches with atorvastatin     Review of Systems: All systems reviewed and negative except where noted in HPI.   Physical Exam: BP 130/72   Pulse 83   Ht 5\' 1"  (1.549 m)   Wt 159 lb 4 oz (72.2 kg)   BMI 30.09 kg/m  Constitutional: Pleasant,well-developed, female in no acute distress. HEENT: Normocephalic and atraumatic. Conjunctivae are normal. No scleral icterus. Cardiovascular: Normal rate, regular rhythm.  Pulmonary/chest: Effort normal and breath sounds normal. No  wheezing, rales or rhonchi. Abdominal: Soft, nondistended, nontender. Bowel sounds active throughout. There are no masses palpable. No hepatomegaly. Extremities: Trace BLE edema Neurological: Alert and oriented to person place and time. Skin: Skin is warm and dry. No rashes noted. Psychiatric: Normal mood and affect. Behavior is normal.  Labs 02/2021: Cologuard negative  Labs 03/2022: BMP with mildly low K of 3.4. CBC nml. Vit D nml. LFTs nml. TSH nml.   ASSESSMENT AND PLAN: N&V Early satiety  GERD Epigastric ab pain Patient presents with N&V, early satiety, GERD, and epigastric ab pain that has improved after she switched to a bland diet. Her symptoms may be due to GERD, PUD, or gallstones. Will plan to start off her evaluation with a RUQ U/S and EGD for further evaluation. Also gave her information on conservative ways to prevent GERD. - GERD hand out - RUQ U/S - EGD [facility]  Christia Reading, MD  I spent 45 minutes of time, including in depth chart review, independent review of results as outlined above, communicating results with the patient directly, face-to-face time with the patient, coordinating care, ordering studies and medications as appropriate, and documentation.

## 2022-06-23 NOTE — Patient Instructions (Signed)
_______________________________________________________  If you are age 58 or older, your body mass index should be between 23-30. Your Body mass index is 30.09 kg/m. If this is out of the aforementioned range listed, please consider follow up with your Primary Care Provider.  If you are age 82 or younger, your body mass index should be between 19-25. Your Body mass index is 30.09 kg/m. If this is out of the aformentioned range listed, please consider follow up with your Primary Care Provider.   ________________________________________________________  The [facility] providers would like to encourage you to use [facility] to communicate with providers for non-urgent requests or questions.  Due to long hold times on the telephone, sending your provider a message by [facility] may be a faster and more efficient way to get a response.  Please allow 48 business hours for a response.  Please remember that this is for non-urgent requests.  _______________________________________________________   Dennis Bast have been scheduled for an endoscopy. Please follow written instructions given to you at your visit today. If you use inhalers (even only as needed), please bring them with you on the day of your procedure.   You have been scheduled for an abdominal ultrasound at [facility] (1st floor of hospital) on 07/11/2022 at 7:00 am. Please arrive 30 minutes prior to your appointment for registration. Make certain not to have anything to eat or drink 8 hours prior to your appointment. Should you need to reschedule your appointment, please contact radiology at [phone]. This test typically takes about 30 minutes to perform.   Due to recent changes in healthcare laws, you may see the results of your imaging and laboratory studies on MyChart before your provider has had a chance to review them.  We understand that in some cases there may be results that are confusing or concerning to you. Not all laboratory  results come back in the same time frame and the provider may be waiting for multiple results in order to interpret others.  Please give [place] 48 hours in order for your provider to thoroughly review all the results before contacting the office for clarification of your results.    Thank you for entrusting me with your care and choosing [facility].  Dr Lorenso Courier

## 2022-07-03 ENCOUNTER — Other Ambulatory Visit: Payer: Self-pay

## 2022-07-03 MED ORDER — DULOXETINE HCL 20 MG PO CPEP: 20.0000 mg | ORAL_CAPSULE | Freq: Every day | ORAL | 3 refills | Status: DC

## 2022-07-10 ENCOUNTER — Ambulatory Visit: Payer: BC Managed Care – PPO | Admitting: Family Medicine

## 2022-07-10 ENCOUNTER — Encounter: Payer: Self-pay | Admitting: Family Medicine

## 2022-07-10 VITALS — BP 132/82 | HR 98 | Temp 97.6°F | Resp 17 | Ht 61.0 in | Wt 160.1 lb

## 2022-07-10 DIAGNOSIS — E785 Hyperlipidemia, unspecified: Secondary | ICD-10-CM

## 2022-07-10 DIAGNOSIS — F32A Depression, unspecified: Secondary | ICD-10-CM

## 2022-07-10 DIAGNOSIS — I10 Essential (primary) hypertension: Secondary | ICD-10-CM

## 2022-07-10 MED ORDER — VALSARTAN-HYDROCHLOROTHIAZIDE 80-12.5 MG PO TABS: 1.0000 | ORAL_TABLET | Freq: Every day | ORAL | 1 refills | Status: DC

## 2022-07-10 MED ORDER — AMLODIPINE BESYLATE 10 MG PO TABS
10.0000 mg | ORAL_TABLET | Freq: Every day | ORAL | 1 refills | Status: DC
Start: 2022-07-10 — End: 2022-12-25

## 2022-07-10 MED ORDER — DULOXETINE HCL 20 MG PO CPEP
20.0000 mg | ORAL_CAPSULE | Freq: Every day | ORAL | 1 refills | Status: DC
Start: 2022-07-10 — End: 2022-10-04

## 2022-07-10 MED ORDER — OMEPRAZOLE 20 MG PO CPDR: 20.0000 mg | DELAYED_RELEASE_CAPSULE | Freq: Every day | ORAL | 1 refills | Status: DC

## 2022-07-10 MED ORDER — VALSARTAN 80 MG PO TABS: 80.0000 mg | ORAL_TABLET | Freq: Two times a day (BID) | ORAL | 1 refills | Status: DC

## 2022-07-10 NOTE — Progress Notes (Signed)
   Subjective:    Patient ID: Patricia Kennedy, female    DOB: 09-23-1965, 56 y.o.   MRN: [id number]  HPI Depression- at last visit pt was started on Cymbalta 20mg  at last visit.  Pt reports medication has been helpful.  In the interim, she has been laid off.  Pt reports she will sleep 'too much' if she is home.  Says she is fine when out of the house.  Family and coworkers have noticed improvement.    Hyperlipidemia- pt is not interested in taking medication.  Wants to stop Zetia.  HTN- chronic problem. Would like to stop the Valsartan BID and restart the combo medication once daily.  BP is adequately controlled.  Denies CP, Has, visual changes, edema.    Review of Systems For ROS see HPI     Objective:   Physical Exam Vitals reviewed.  Constitutional:      General: She is not in acute distress.    Appearance: Normal appearance. She is well-developed. She is not ill-appearing.  HENT:     Head: Normocephalic and atraumatic.  Eyes:     Conjunctiva/sclera: Conjunctivae normal.     Pupils: Pupils are equal, round, and reactive to light.  Neck:     Thyroid: No thyromegaly.  Cardiovascular:     Rate and Rhythm: Normal rate and regular rhythm.     Heart sounds: Normal heart sounds. No murmur heard. Pulmonary:     Effort: Pulmonary effort is normal. No respiratory distress.     Breath sounds: Normal breath sounds.  Abdominal:     General: There is no distension.     Palpations: Abdomen is soft.     Tenderness: There is no abdominal tenderness.  Musculoskeletal:     Cervical back: Normal range of motion and neck supple.  Lymphadenopathy:     Cervical: No cervical adenopathy.  Skin:    General: Skin is warm and dry.  Neurological:     Mental Status: She is alert and oriented to person, place, and time.  Psychiatric:        Behavior: Behavior normal.           Assessment & Plan:

## 2022-07-10 NOTE — Patient Instructions (Addendum)
Schedule your complete physical for late Feb or early March No need for blood work today- yay!!! CONTINUE the Duloxetine daily SWITCH back to the Valsartan HCTZ once daily Work on [employer] and regular physical activity to help lower cholesterol Call with any questions or concerns Happy Holidays!

## 2022-07-11 ENCOUNTER — Ambulatory Visit (HOSPITAL_COMMUNITY): Admission: RE | Admit: 2022-07-11 | Payer: BC Managed Care – PPO | Source: Ambulatory Visit

## 2022-07-11 ENCOUNTER — Ambulatory Visit (AMBULATORY_SURGERY_CENTER): Payer: BC Managed Care – PPO | Admitting: Internal Medicine

## 2022-07-11 ENCOUNTER — Encounter: Payer: Self-pay | Admitting: Internal Medicine

## 2022-07-11 VITALS — BP 123/74 | HR 75 | Temp 97.8°F | Resp 16 | Ht 61.0 in | Wt 159.0 lb

## 2022-07-11 DIAGNOSIS — K209 Esophagitis, unspecified without bleeding: Secondary | ICD-10-CM

## 2022-07-11 DIAGNOSIS — R112 Nausea with vomiting, unspecified: Secondary | ICD-10-CM

## 2022-07-11 DIAGNOSIS — B9681 Helicobacter pylori [H. pylori] as the cause of diseases classified elsewhere: Secondary | ICD-10-CM | POA: Diagnosis not present

## 2022-07-11 DIAGNOSIS — K2970 Gastritis, unspecified, without bleeding: Secondary | ICD-10-CM

## 2022-07-11 DIAGNOSIS — K2950 Unspecified chronic gastritis without bleeding: Secondary | ICD-10-CM | POA: Diagnosis not present

## 2022-07-11 MED ORDER — SODIUM CHLORIDE 0.9 % IV SOLN: 500.0000 mL | Freq: Once | INTRAVENOUS | Status: DC

## 2022-07-11 MED ORDER — OMEPRAZOLE 40 MG PO CPDR: 40.0000 mg | DELAYED_RELEASE_CAPSULE | Freq: Two times a day (BID) | ORAL | 0 refills | Status: DC

## 2022-07-11 NOTE — Progress Notes (Signed)
Called to room to assist during endoscopic procedure.  Patient ID and intended procedure confirmed with present staff. Received instructions for my participation in the procedure from the performing physician.  

## 2022-07-11 NOTE — Progress Notes (Signed)
A and O x3. Report to RN. Tolerated MAC anesthesia well.Teeth unchanged after procedure. 

## 2022-07-11 NOTE — Progress Notes (Signed)
Pt's states no medical or surgical changes since previsit or office visit. 

## 2022-07-11 NOTE — Patient Instructions (Signed)
Handout on gastritis given to patient. Await pathology results. Resume previous diet and continue present medications. Use Prilosec (omeprazole) 40 mg twice a day for 8 weeks - pick up from [facility] pharmacy  Return to the GI clinic in 6 weeks for follow-up (please call to schedule appointment)   YOU HAD AN ENDOSCOPIC PROCEDURE TODAY AT THE [facility]:   Refer to the procedure report that was given to you for any specific questions about what was found during the examination.  If the procedure report does not answer your questions, please call your gastroenterologist to clarify.  If you requested that your care partner not be given the details of your procedure findings, then the procedure report has been included in a sealed envelope for you to review at your convenience later.  YOU SHOULD EXPECT: Some feelings of bloating in the abdomen. Passage of more gas than usual.  Walking can help get rid of the air that was put into your GI tract during the procedure and reduce the bloating. If you had a lower endoscopy (such as a colonoscopy or flexible sigmoidoscopy) you may notice spotting of blood in your stool or on the toilet paper. If you underwent a bowel prep for your procedure, you may not have a normal bowel movement for a few days.  Please Note:  You might notice some irritation and congestion in your nose or some drainage.  This is from the oxygen used during your procedure.  There is no need for concern and it should clear up in a day or so.  SYMPTOMS TO REPORT IMMEDIATELY:  Following upper endoscopy (EGD)  Vomiting of blood or coffee ground material  New chest pain or pain under the shoulder blades  Painful or persistently difficult swallowing  New shortness of breath  Fever of 100F or higher  Black, tarry-looking stools  For urgent or emergent issues, a gastroenterologist can be reached at any hour by calling [phone]. Do not use MyChart messaging for urgent  concerns.    DIET:  We do recommend a small meal at first, but then you may proceed to your regular diet.  Drink plenty of fluids but you should avoid alcoholic beverages for 24 hours.  ACTIVITY:  You should plan to take it easy for the rest of today and you should NOT DRIVE or use heavy machinery until tomorrow (because of the sedation medicines used during the test).    FOLLOW UP: Our staff will call the number listed on your records the next business day following your procedure.  We will call around 7:15- 8:00 am to check on you and address any questions or concerns that you may have regarding the information given to you following your procedure. If we do not reach you, we will leave a message.     If any biopsies were taken you will be contacted by phone or by letter within the next 1-3 weeks.  Please call us at [phone] if you have not heard about the biopsies in 3 weeks.    SIGNATURES/CONFIDENTIALITY: You and/or your care partner have signed paperwork which will be entered into your electronic medical record.  These signatures attest to the fact that that the information above on your After Visit Summary has been reviewed and is understood.  Full responsibility of the confidentiality of this discharge information lies with you and/or your care-partner.

## 2022-07-11 NOTE — Progress Notes (Signed)
GASTROENTEROLOGY PROCEDURE H&P NOTE   Primary Care Physician: Sheliah Hatch, MD    Reason for Procedure:   N&V, early satiety, GERD, epigastric ab pain  Plan:    EGD  Patient is appropriate for endoscopic procedure(s) in the ambulatory ([facility]) setting.  The nature of the procedure, as well as the risks, benefits, and alternatives were carefully and thoroughly reviewed with the patient. Ample time for discussion and questions allowed. The patient understood, was satisfied, and agreed to proceed.     HPI: Patricia Kennedy is a 56 y.o. female who presents for EGD for evaluation of N&V, early satiety, GERD, and epigastric ab pain .  Patient was most recently seen in the Gastroenterology Clinic on 06/23/22.  No interval change in medical history since that appointment. Please refer to that note for full details regarding GI history and clinical presentation.   Past Medical History:  Diagnosis Date   Dysuria 02/11/2016   Essential hypertension 02/11/2016   GERD (gastroesophageal reflux disease) 02/20/2016   Hypertension    Leg pain, right 02/11/2016    Past Surgical History:  Procedure Laterality Date   HYSTEROSCOPY     and D and C for incomplete miscarriage    Prior to Admission medications   Medication Sig Start Date End Date Taking? Authorizing Provider  amLODipine (NORVASC) 10 MG tablet Take 1 tablet (10 mg total) by mouth daily. 07/10/22 01/06/23 Yes Sheliah Hatch, MD  cholecalciferol (VITAMIN D3) 25 MCG (1000 UNIT) tablet Take 1,000 Units by mouth daily.   Yes [provider]  DULoxetine (CYMBALTA) 20 MG capsule Take 1 capsule (20 mg total) by mouth daily. 07/10/22  Yes Sheliah Hatch, MD  valsartan-hydrochlorothiazide (DIOVAN-HCT) 80-12.5 MG tablet Take 1 tablet by mouth daily. 07/10/22  Yes Sheliah Hatch, MD  vitamin B-12 (CYANOCOBALAMIN) 100 MCG tablet Take 100 mcg by mouth daily.   Yes [provider]  Vitamin D, Ergocalciferol,  (DRISDOL) 1.25 MG (50000 UNIT) CAPS capsule Take 1 capsule (50,000 Units total) by mouth every 7 (seven) days. 03/01/21  Yes Sheliah Hatch, MD  vitamin k 100 MCG tablet Take 100 mcg by mouth daily.   Yes [provider]  albuterol (VENTOLIN HFA) 108 (90 Base) MCG/ACT inhaler Inhale 2 puffs into the lungs every 4 (four) hours as needed for wheezing or shortness of breath. 10/07/21   Sheliah Hatch, MD  cetirizine (ZYRTEC) 10 MG tablet Take 10 mg by mouth daily. 08/26/21   [provider]  ezetimibe (ZETIA) 10 MG tablet Take 1 tablet (10 mg total) by mouth daily. Patient not taking: Reported on 06/23/2022 04/17/22   Sheliah Hatch, MD  fluticasone [facility]) 50 MCG/ACT nasal spray Place 2 sprays into both nostrils daily. Patient not taking: Reported on 07/11/2022 09/15/20   Sheliah Hatch, MD  omeprazole (PRILOSEC) 20 MG capsule Take 1 capsule (20 mg total) by mouth daily. Patient not taking: Reported on 07/11/2022 07/10/22   Sheliah Hatch, MD  Turmeric (QC TUMERIC COMPLEX) 500 MG CAPS Take by mouth. Patient not taking: Reported on 07/11/2022    [provider]    Current Outpatient Medications  Medication Sig Dispense Refill   amLODipine (NORVASC) 10 MG tablet Take 1 tablet (10 mg total) by mouth daily. 90 tablet 1   cholecalciferol (VITAMIN D3) 25 MCG (1000 UNIT) tablet Take 1,000 Units by mouth daily.     DULoxetine (CYMBALTA) 20 MG capsule Take 1 capsule (20 mg total) by mouth daily. 90  capsule 1   valsartan-hydrochlorothiazide (DIOVAN-HCT) 80-12.5 MG tablet Take 1 tablet by mouth daily. 90 tablet 1   vitamin B-12 (CYANOCOBALAMIN) 100 MCG tablet Take 100 mcg by mouth daily.     Vitamin D, Ergocalciferol, (DRISDOL) 1.25 MG (50000 UNIT) CAPS capsule Take 1 capsule (50,000 Units total) by mouth every 7 (seven) days. 12 capsule 0   vitamin k 100 MCG tablet Take 100 mcg by mouth daily.     albuterol (VENTOLIN HFA) 108 (90 Base) MCG/ACT inhaler Inhale  2 puffs into the lungs every 4 (four) hours as needed for wheezing or shortness of breath. 8 g 0   cetirizine (ZYRTEC) 10 MG tablet Take 10 mg by mouth daily.     ezetimibe (ZETIA) 10 MG tablet Take 1 tablet (10 mg total) by mouth daily. (Patient not taking: Reported on 06/23/2022) 90 tablet 1   fluticasone (FLONASE) 50 MCG/ACT nasal spray Place 2 sprays into both nostrils daily. (Patient not taking: Reported on 07/11/2022) 16 g 6   omeprazole (PRILOSEC) 20 MG capsule Take 1 capsule (20 mg total) by mouth daily. (Patient not taking: Reported on 07/11/2022) 90 capsule 1   Turmeric (QC TUMERIC COMPLEX) 500 MG CAPS Take by mouth. (Patient not taking: Reported on 07/11/2022)     Current Facility-Administered Medications  Medication Dose Route Frequency Provider Last Rate Last Admin   0.9 %  sodium chloride infusion  500 mL Intravenous Once Imogene Burn, MD        Allergies as of 07/11/2022 - Review Complete 07/11/2022  Allergen Reaction Noted   Penicillins  02/18/2013   Statins  03/23/2021    Family History  Problem Relation Age of Onset   Heart disease Mother    Hypertension Mother    Stroke Mother    Heart disease Father    Hypertension Father    Heart disease Sister        palpitations   Heart disease Brother    Jaundice Maternal Grandmother        liver disease   GER disease Sister    Heart disease Brother    Heart disease Daughter        MI    Social History   Socioeconomic History   Marital status: Married    Spouse name: Not on file   Number of children: Not on file   Years of education: Not on file   Highest education level: Not on file  Occupational History   Not on file  Tobacco Use   Smoking status: Every Day   Smokeless tobacco: Never  Vaping Use   Vaping Use: Never used  Substance and Sexual Activity   Alcohol use: No   Drug use: No   Sexual activity: Not on file  Other Topics Concern   Not on file  Social History Narrative   Lives with sister, 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    Following [employer] at present   Wears seat belt   Social Determinants of Health   Financial Resource Strain: Not on file  Food Insecurity: Not on file  Transportation Needs: Not on file  Physical Activity: Not on file  Stress: Not on file  Social Connections: Not on file  Intimate Partner Violence: Not on file    Physical Exam: Vital signs in last 24 hours: BP 115/69   Pulse 76   Temp 97.8 F (36.6 C) (Temporal)   Ht 5\' 1"  (1.549 m)   Wt  159 lb (72.1 kg)   SpO2 93%   BMI 30.04 kg/m  GEN: NAD EYE: Sclerae anicteric ENT: MMM CV: Non-tachycardic Pulm: No increased WOB GI: Soft NEURO:  Alert & Oriented   Eulah Pont, MD [facility]   07/11/2022 10:33 AM

## 2022-07-11 NOTE — Op Note (Signed)
Patricia Kennedy Patient Name: Patricia Kennedy Procedure Date: 07/11/2022 10:43 AM MRN: [id number] Endoscopist: Georgian Co , , [id number] Age: 56 Referring MD:  Date of Birth: 1965/09/01 Gender: Female Account #: [id number] Procedure:                Upper GI endoscopy Indications:              Epigastric abdominal pain, Heartburn, Nausea with                            vomiting Medicines:                Monitored Anesthesia Care Procedure:                Pre-Anesthesia Assessment:                           - Prior to the procedure, a History and Physical                            was performed, and patient medications and                            allergies were reviewed. The patient's tolerance of                            previous anesthesia was also reviewed. The risks                            and benefits of the procedure and the sedation                            options and risks were discussed with the patient.                            All questions were answered, and informed consent                            was obtained. Prior Anticoagulants: The patient has                            taken no anticoagulant or antiplatelet agents. ASA                            Grade Assessment: II - A patient with mild systemic                            disease. After reviewing the risks and benefits,                            the patient was deemed in satisfactory condition to                            undergo the procedure.  After obtaining informed consent, the endoscope was                            passed under direct vision. Throughout the                            procedure, the patient's blood pressure, pulse, and                            oxygen saturations were monitored continuously. The                            Endoscope was introduced through the mouth, and                            advanced to the second part of  duodenum. The upper                            GI endoscopy was accomplished without difficulty.                            The patient tolerated the procedure well. Scope In: Scope Out: Findings:                 The examined esophagus was normal. Biopsies were                            taken with a cold forceps for histology.                           Localized inflammation characterized by congestion                            (edema), erythema and granularity was found in the                            gastric body and in the gastric antrum. Biopsies                            were taken with a cold forceps for histology.                           The examined duodenum was normal. Biopsies were                            taken with a cold forceps for histology. Complications:            No immediate complications. Estimated Blood Loss:     Estimated blood loss was minimal. Impression:               - Normal esophagus. Biopsied.                           - Gastritis. Biopsied.                           -  Normal examined duodenum. Biopsied. Recommendation:           - Discharge patient to home (with escort).                           - Await pathology results.                           - Use Prilosec (omeprazole) 40 mg PO BID for 8                            weeks.                           - Return to GI clinic in 6 weeks.                           - The findings and recommendations were discussed                            with the patient. Dr Georgian Co "Patricia Kennedy" Lorenso Courier,  07/11/2022 11:01:42 AM

## 2022-07-12 ENCOUNTER — Telehealth: Payer: Self-pay | Admitting: *Deleted

## 2022-07-12 ENCOUNTER — Telehealth: Payer: Self-pay

## 2022-07-12 DIAGNOSIS — R112 Nausea with vomiting, unspecified: Secondary | ICD-10-CM

## 2022-07-12 DIAGNOSIS — K2970 Gastritis, unspecified, without bleeding: Secondary | ICD-10-CM

## 2022-07-12 MED ORDER — PANTOPRAZOLE SODIUM 40 MG PO TBEC: 40.0000 mg | DELAYED_RELEASE_TABLET | Freq: Every day | ORAL | 3 refills | Status: DC

## 2022-07-12 MED ORDER — PANTOPRAZOLE SODIUM 40 MG PO TBEC: 40.0000 mg | DELAYED_RELEASE_TABLET | Freq: Two times a day (BID) | ORAL | 0 refills | Status: DC

## 2022-07-12 NOTE — Telephone Encounter (Signed)
Called patient to clarify the medication order sent on 11/21.  The omeprazole 40 mg is not covered by the patients insurance and will cost her $50 out of pocket.  At her request we have sent an alternative of pantoprazole 40 mg to her pharmacy.  She will compare OOP expense and decide which to purchase.  I also informed the patient that she can buy 20 mg omeprazole OTC and would just take 2 a day.  Patient verbalized understanding.

## 2022-07-12 NOTE — Telephone Encounter (Signed)
  Follow up Call-     07/11/2022   10:10 AM  Call back number  Post procedure Call Back phone  # [phone]  Permission to leave phone message Yes     Patient questions:  Do you have a fever, pain , or abdominal swelling? No. Pain Score  0 *  Have you tolerated food without any problems? Yes.    Have you been able to return to your normal activities? Yes.    Do you have any questions about your discharge instructions: Diet   No. Medications  Yes.  States medicine was not called in. Charge nurse will investigate. Follow up visit  No.  Do you have questions or concerns about your Care? Yes.    Actions: * If pain score is 4 or above: No action needed, pain <4.

## 2022-07-14 ENCOUNTER — Ambulatory Visit (HOSPITAL_COMMUNITY): Admission: RE | Admit: 2022-07-14 | Payer: BC Managed Care – PPO | Source: Ambulatory Visit

## 2022-07-16 NOTE — Assessment & Plan Note (Signed)
Pt is not interested in taking any medication at this time.  Wants to stop Zetia.  Unclear if she has already stopped medication at this point.  Check labs and determine what is needed.

## 2022-07-16 NOTE — Assessment & Plan Note (Signed)
Chronic problem.  Pt would like to stop the Valsartan twice daily as she admits she often forgets the 2nd pill.  Wants to restart the Valsartan HCTZ once daily.  New prescription sent.  Pt aware

## 2022-07-16 NOTE — Assessment & Plan Note (Signed)
At last visit pt was started on Cymbalta 20mg  daily.  She feels medication has been helpful.  Family and coworkers have noticed improvement in her irritability and mood.  Encouraged her to stay busy- even on the days she's not working- to avoid excessive sleep.  She does not want to make med changes at this time.  Will follow.

## 2022-07-18 ENCOUNTER — Encounter: Payer: Self-pay | Admitting: Internal Medicine

## 2022-07-18 NOTE — Progress Notes (Signed)
Hi [name], please let the patient know that gastric biopsies pathology came back positive for H pylori gastritis. Recommend bismuth quadruple therapy for treatment: - Tetracycline 500 mg QID x 14 days - Flagyl 250 mg QID x 14 days - Bismuth subsalicylate 524 mg QID x 14 days - PPI BID x 14 days  Let's plan for a follow up appt in 6 weeks to test for eradication. Thanks.

## 2022-07-21 ENCOUNTER — Other Ambulatory Visit: Payer: Self-pay | Admitting: Family Medicine

## 2022-07-21 ENCOUNTER — Ambulatory Visit (INDEPENDENT_AMBULATORY_CARE_PROVIDER_SITE_OTHER): Payer: BC Managed Care – PPO | Admitting: Family Medicine

## 2022-07-21 VITALS — BP 134/70 | HR 97 | Temp 97.6°F | Ht 61.0 in | Wt 164.8 lb

## 2022-07-21 DIAGNOSIS — U071 COVID-19: Secondary | ICD-10-CM

## 2022-07-21 MED ORDER — MOLNUPIRAVIR EUA 200MG CAPSULE: 4.0000 | ORAL_CAPSULE | Freq: Two times a day (BID) | ORAL | 0 refills | Status: AC

## 2022-07-21 MED ORDER — GUAIFENESIN-CODEINE 100-10 MG/5ML PO SYRP: 10.0000 mL | ORAL_SOLUTION | Freq: Three times a day (TID) | ORAL | 0 refills | Status: DC | PRN

## 2022-07-21 NOTE — Telephone Encounter (Signed)
Patient called in stating that the pharmacy did not have the cough medicine in stock and would like it to be sent to [facility] on [address] in [place] instead.

## 2022-07-21 NOTE — Progress Notes (Signed)
   Subjective:    Patient ID: Patricia Kennedy, female    DOB: 04/06/1966, 56 y.o.   MRN: [id number]  HPI Cough- sxs started ~3 days ago.  Pt reports subjective fever- feeling cold, then hot.  + vomiting yesterday.  + body aches.  No skin sensitivity.  + productive cough.  + SOB.  + HA.  + R ear pain, sinus pressure, sore throat.   Review of Systems For ROS see HPI     Objective:   Physical Exam Vitals reviewed.  Constitutional:      General: She is not in acute distress.    Appearance: She is well-developed. She is ill-appearing.     Comments: Obviously not feeling well  HENT:     Head: Normocephalic and atraumatic.     Right Ear: Tympanic membrane normal.     Left Ear: Tympanic membrane normal.     Nose: Mucosal edema, congestion and rhinorrhea present.     Right Sinus: No maxillary sinus tenderness or frontal sinus tenderness.     Left Sinus: No maxillary sinus tenderness or frontal sinus tenderness.     Mouth/Throat:     Pharynx: Uvula midline. Posterior oropharyngeal erythema present. No oropharyngeal exudate.  Eyes:     Conjunctiva/sclera: Conjunctivae normal.     Pupils: Pupils are equal, round, and reactive to light.  Cardiovascular:     Rate and Rhythm: Normal rate and regular rhythm.     Heart sounds: Normal heart sounds.  Pulmonary:     Effort: Pulmonary effort is normal. No respiratory distress.     Breath sounds: Normal breath sounds. No wheezing.     Comments: Wet, hacking cough Musculoskeletal:     Cervical back: Normal range of motion and neck supple.  Lymphadenopathy:     Cervical: No cervical adenopathy.  Skin:    General: Skin is warm.  Neurological:     Mental Status: She is alert.           Assessment & Plan:   COVID- new.  Start Molnupiravir.  Cough syrup prn.  Encouraged Vit D, Vit C, and Zinc.  Reviewed supportive care and red flags that should prompt return.  Pt expressed understanding and is in agreement w/ plan.

## 2022-07-21 NOTE — Patient Instructions (Addendum)
Follow up as needed or as scheduled START the Molnupiravir as directed USE the cough syrup as needed- may cause drowsiness Drink LOTS of fluids REST! Vit D, Vit C, and Zinc will all help your immune system Call with any questions or concerns Hang in there!!!

## 2022-07-23 ENCOUNTER — Encounter: Payer: Self-pay | Admitting: Family Medicine

## 2022-07-24 ENCOUNTER — Telehealth: Payer: Self-pay | Admitting: Family Medicine

## 2022-07-24 NOTE — Telephone Encounter (Signed)
Seen 07/21/22 for COVID anything to advise? It has only been 3 days

## 2022-07-24 NOTE — Telephone Encounter (Signed)
Caller name: Beatric Fulop  On [place]?: Yes  Call back number: [phone] (mobile)  Provider they see: Sheliah Hatch, MD  Reason for call: Pt sister called stating that sister is almost done with the Robitussin Ac. Pt isn't feeling any better. Both of patient eyes are swollen. Sister wants to know what should her sister do next.

## 2022-07-24 NOTE — Telephone Encounter (Signed)
Patient informed. 

## 2022-07-24 NOTE — Telephone Encounter (Signed)
She can take Robitussin or Delsym over the counter for cough.  Cool compresses to eyes/face.  It has only been 3 days since diagnosis and unfortunately, this needs to run its course.  But that being said, if symptoms worsen or she has shortness of breath, she needs to go to the ER for evaluation

## 2022-07-26 ENCOUNTER — Telehealth: Payer: Self-pay | Admitting: Family Medicine

## 2022-07-26 NOTE — Telephone Encounter (Signed)
Forms placed in Dr Tabori to be signed folder  

## 2022-07-26 NOTE — Telephone Encounter (Signed)
Received disability forms from [employer]. Placed in front bin with charge sheet.

## 2022-07-27 ENCOUNTER — Encounter: Payer: Self-pay | Admitting: Family Medicine

## 2022-07-27 ENCOUNTER — Ambulatory Visit (INDEPENDENT_AMBULATORY_CARE_PROVIDER_SITE_OTHER): Payer: BC Managed Care – PPO | Admitting: Family Medicine

## 2022-07-27 ENCOUNTER — Telehealth: Payer: Self-pay | Admitting: Family Medicine

## 2022-07-27 ENCOUNTER — Ambulatory Visit (HOSPITAL_BASED_OUTPATIENT_CLINIC_OR_DEPARTMENT_OTHER)
Admission: RE | Admit: 2022-07-27 | Discharge: 2022-07-27 | Disposition: A | Discharge status: Home / Self Care | Payer: BC Managed Care – PPO | Source: Ambulatory Visit | Attending: Family Medicine | Admitting: Family Medicine

## 2022-07-27 VITALS — BP 132/84 | HR 89 | Temp 100.6°F | Resp 19 | Ht 61.0 in | Wt 157.4 lb

## 2022-07-27 DIAGNOSIS — R051 Acute cough: Secondary | ICD-10-CM | POA: Insufficient documentation

## 2022-07-27 DIAGNOSIS — U071 COVID-19: Secondary | ICD-10-CM

## 2022-07-27 LAB — POCT INFLUENZA A/B
Influenza A, POC: NEGATIVE
Influenza B, POC: NEGATIVE

## 2022-07-27 MED ORDER — GUAIFENESIN-CODEINE 100-10 MG/5ML PO SYRP: 10.0000 mL | ORAL_SOLUTION | Freq: Three times a day (TID) | ORAL | 0 refills | Status: DC | PRN

## 2022-07-27 MED ORDER — ALBUTEROL SULFATE HFA 108 (90 BASE) MCG/ACT IN AERS: 2.0000 | INHALATION_SPRAY | RESPIRATORY_TRACT | 0 refills | Status: AC | PRN

## 2022-07-27 MED ORDER — DOXYCYCLINE HYCLATE 100 MG PO TABS: 100.0000 mg | ORAL_TABLET | Freq: Two times a day (BID) | ORAL | 0 refills | Status: DC

## 2022-07-27 MED ORDER — IPRATROPIUM-ALBUTEROL 0.5-2.5 (3) MG/3ML IN SOLN: 3.0000 mL | RESPIRATORY_TRACT | 0 refills | Status: DC | PRN

## 2022-07-27 NOTE — Progress Notes (Signed)
   Subjective:    Patient ID: Patricia Kennedy, female    DOB: Oct 11, 1965, 56 y.o.   MRN: [id number]  HPI COVID- 'i think I have pneumonia now'.  Having viral conjunctivitis.  Is coughing to the point of gagging and vomiting.  Asking for refill on DuoNeb.  Continues to have fever.  Has lost smell.  Ongoing sore throat and congestion.   Review of Systems For ROS see HPI     Objective:   Physical Exam Vitals reviewed.  Constitutional:      Appearance: She is ill-appearing.  HENT:     Head: Normocephalic and atraumatic.  Eyes:     General:        Right eye: Discharge present.        Left eye: Discharge present. Pulmonary:     Effort: No respiratory distress.     Breath sounds: Wheezing and rhonchi (diffusely throughout) present.     Comments: Deep, hacking, almost continous cough Skin:    General: Skin is warm and dry.  Neurological:     General: No focal deficit present.     Mental Status: She is oriented to person, place, and time.  Psychiatric:        Mood and Affect: Mood normal.        Behavior: Behavior normal.        Thought Content: Thought content normal.           Assessment & Plan:   COVID- ongoing issue for pt.  Sxs are worsening rather than improving.  Thankfully O2 sats are stable.  Her lung exam is consistent w/ PNA.  Most likely viral since she has COVID but given her smoking hx will add abx to cover for bacterial infection.  Pt to restart albuterol inhaler and nebs.  Cough syrup as needed.  Will make any changes if needed based on xray results.  If sxs worsen, pt will need to go to ER.

## 2022-07-27 NOTE — Telephone Encounter (Signed)
Pt would like the Robitussin sent to [facility] in [facility]  [address] AT [facility] [address] and [address]

## 2022-07-27 NOTE — Telephone Encounter (Signed)
Patient called stating that [facility] doesn't have guaifenesin-codeine in stock please send to [id number] - [place], [place] - [address] AT [facility] [address].

## 2022-07-27 NOTE — Telephone Encounter (Signed)
Prescription sent to [facility]

## 2022-07-27 NOTE — Patient Instructions (Addendum)
Go to [address] and enter the front door (NOT the ER) to get your Xray START the Doxycycline twice daily- take w/ food USE the Albuterol inhaler or nebulizer machine every 4 hrs as needed for cough or shortness of breath Use the cough syrup as needed for sleep Continue to drink LOTS of fluids Continue to take Tylenol and ibuprofen for fever and aches REST!! Call with any questions or concerns Hang in there!!!

## 2022-07-27 NOTE — Telephone Encounter (Signed)
Informed pt that we sent Rx to [facility] as she requested

## 2022-07-28 ENCOUNTER — Telehealth: Payer: Self-pay | Admitting: Internal Medicine

## 2022-07-28 ENCOUNTER — Telehealth: Payer: Self-pay

## 2022-07-28 NOTE — Telephone Encounter (Signed)
Inbound call from patient stating that she received a letter in the mail from her biopsy results and is requesting a call back to discuss. Please advise.

## 2022-07-28 NOTE — Telephone Encounter (Signed)
-----   Message from Sheliah Hatch, MD sent at 07/28/2022  7:30 AM EST ----- Your chest xray shows a R lower lobe pneumonia- which we have you on antibiotics for.  It also shows evidence in both lungs of COVID infection.  If your symptoms- shortness of breath, fever, or other concerns- worsen, please go to the ER for evaluation and treatment

## 2022-07-28 NOTE — Telephone Encounter (Signed)
Informed pt of chest xray results  

## 2022-08-01 ENCOUNTER — Other Ambulatory Visit: Payer: Self-pay

## 2022-08-01 MED ORDER — TETRACYCLINE HCL 500 MG PO CAPS: 500.0000 mg | ORAL_CAPSULE | Freq: Four times a day (QID) | ORAL | 0 refills | Status: AC

## 2022-08-01 MED ORDER — METRONIDAZOLE 250 MG PO TABS: 250.0000 mg | ORAL_TABLET | Freq: Four times a day (QID) | ORAL | 0 refills | Status: AC

## 2022-08-01 NOTE — Telephone Encounter (Signed)
See the results from surgical pathology 07/11/2022.

## 2022-08-02 ENCOUNTER — Telehealth: Payer: Self-pay

## 2022-08-02 NOTE — Telephone Encounter (Signed)
We received disability forms for pt . Placed in Dr Beverely Low to be signed folder

## 2022-08-04 NOTE — Telephone Encounter (Signed)
Pt sister called and reported due date for paperwork is 08/09/22

## 2022-08-07 NOTE — Telephone Encounter (Signed)
Patient's sister called to check on forms, I let her know that Dr Beverely Low did still have them and we would call her when they were ready.

## 2022-08-08 DIAGNOSIS — Z0279 Encounter for issue of other medical certificate: Secondary | ICD-10-CM

## 2022-08-08 NOTE — Telephone Encounter (Signed)
Form completed and returned to [place] 

## 2022-08-08 NOTE — Telephone Encounter (Signed)
Forms have been faxed and placed in scan  

## 2022-10-04 ENCOUNTER — Other Ambulatory Visit: Payer: Self-pay

## 2022-10-04 ENCOUNTER — Telehealth: Payer: Self-pay | Admitting: Family Medicine

## 2022-10-04 MED ORDER — DULOXETINE HCL 20 MG PO CPEP: 20.0000 mg | ORAL_CAPSULE | Freq: Every day | ORAL | 1 refills | Status: DC

## 2022-10-04 NOTE — Telephone Encounter (Signed)
Encourage patient to contact the pharmacy for refills or they can request refills through [place]: [place], [place] NAME & DOSE:  DULoxetine (CYMBALTA) 20 MG capsule  NOTES/COMMENTS FROM PATIENT:      [place] office please notify patient: It takes 48-72 hours to process rx refill requests Ask patient to call pharmacy to ensure rx is ready before heading there.

## 2022-10-04 NOTE — Telephone Encounter (Signed)
Sent called and informed

## 2022-10-06 ENCOUNTER — Encounter: Payer: Self-pay | Admitting: Internal Medicine

## 2022-10-06 ENCOUNTER — Ambulatory Visit: Payer: 59 | Admitting: Internal Medicine

## 2022-10-06 VITALS — BP 134/80 | HR 79 | Ht 60.0 in | Wt 152.1 lb

## 2022-10-06 DIAGNOSIS — K2990 Gastroduodenitis, unspecified, without bleeding: Secondary | ICD-10-CM | POA: Diagnosis not present

## 2022-10-06 DIAGNOSIS — R112 Nausea with vomiting, unspecified: Secondary | ICD-10-CM | POA: Diagnosis not present

## 2022-10-06 DIAGNOSIS — K2970 Gastritis, unspecified, without bleeding: Secondary | ICD-10-CM

## 2022-10-06 DIAGNOSIS — Z8619 Personal history of other infectious and parasitic diseases: Secondary | ICD-10-CM

## 2022-10-06 MED ORDER — OMEPRAZOLE 40 MG PO CPDR: 40.0000 mg | DELAYED_RELEASE_CAPSULE | Freq: Every day | ORAL | 0 refills | Status: DC

## 2022-10-06 NOTE — Progress Notes (Signed)
Chief Complaint: N&V, early satiety  HPI : 57 year old female with history of H pylori, GERD and HTN presents for follow up of H pylori, N&V and early satiety  Interval History: She completed the H pylori treatment about 1.5 months ago. She overall is feeling better. She was taking the PPI BID but has now switched to taking it QD. Denies N&V. Denies burning in chest or abdominal pain. She is still smoking. She has been trying to follow a reflux friendly diet  Wt Readings from Last 3 Encounters:  10/06/22 152 lb 2 oz (69 kg)  07/27/22 157 lb 6 oz (71.4 kg)  07/21/22 164 lb 12.8 oz (74.8 kg)   Current Outpatient Medications  Medication Sig Dispense Refill   amLODipine (NORVASC) 10 MG tablet Take 1 tablet (10 mg total) by mouth daily. 90 tablet 1   DULoxetine (CYMBALTA) 20 MG capsule Take 1 capsule (20 mg total) by mouth daily. 90 capsule 1   valsartan-hydrochlorothiazide (DIOVAN-HCT) 80-12.5 MG tablet Take 1 tablet by mouth daily. 90 tablet 1   albuterol (VENTOLIN HFA) 108 (90 Base) MCG/ACT inhaler Inhale 2 puffs into the lungs every 4 (four) hours as needed for wheezing or shortness of breath. (Patient not taking: Reported on 10/06/2022) 8 g 0   cetirizine (ZYRTEC) 10 MG tablet Take 10 mg by mouth daily. (Patient not taking: Reported on 10/06/2022)     cholecalciferol (VITAMIN D3) 25 MCG (1000 UNIT) tablet Take 1,000 Units by mouth daily. (Patient not taking: Reported on 10/06/2022)     doxycycline (VIBRA-TABS) 100 MG tablet Take 1 tablet (100 mg total) by mouth 2 (two) times daily. (Patient not taking: Reported on 10/06/2022) 20 tablet 0   guaiFENesin-codeine (ROBITUSSIN AC) 100-10 MG/5ML syrup Take 10 mLs by mouth 3 (three) times daily as needed for cough. (Patient not taking: Reported on 10/06/2022) 120 mL 0   ipratropium-albuterol (DUONEB) 0.5-2.5 (3) MG/3ML SOLN Take 3 mLs by nebulization every 4 (four) hours as needed. (Patient not taking: Reported on 10/06/2022) 360 mL 0   omeprazole  (PRILOSEC) 40 MG capsule Take 1 capsule (40 mg total) by mouth in the morning and at bedtime. 120 capsule 0   pantoprazole (PROTONIX) 40 MG tablet Take 1 tablet (40 mg total) by mouth 2 (two) times daily. (Patient not taking: Reported on 10/06/2022) 120 tablet 0   vitamin B-12 (CYANOCOBALAMIN) 100 MCG tablet Take 100 mcg by mouth daily. (Patient not taking: Reported on 10/06/2022)     Vitamin D, Ergocalciferol, (DRISDOL) 1.25 MG (50000 UNIT) CAPS capsule Take 1 capsule (50,000 Units total) by mouth every 7 (seven) days. (Patient not taking: Reported on 10/06/2022) 12 capsule 0   vitamin k 100 MCG tablet Take 100 mcg by mouth daily. (Patient not taking: Reported on 10/06/2022)     No current facility-administered medications for this visit.   Physical Exam: BP 134/80   Pulse 79   Ht 5' (1.524 m)   Wt 152 lb 2 oz (69 kg)   BMI 29.71 kg/m  Constitutional: Pleasant,well-developed, female in no acute distress. HEENT: Normocephalic and atraumatic. Conjunctivae are normal. No scleral icterus. Cardiovascular: Normal rate, regular rhythm.  Pulmonary/chest: Effort normal and breath sounds normal. No wheezing, rales or rhonchi. Abdominal: Soft, nondistended, nontender. There are no masses palpable. No hepatomegaly. Extremities: Trace BLE edema Neurological: Alert and oriented to person place and time. Skin: Skin is warm and dry. No rashes noted. Psychiatric: Normal mood and affect. Behavior is normal.  Labs 02/2021: Cologuard negative  Labs 03/2022: BMP with mildly low K of 3.4. CBC nml. Vit D nml. LFTs nml. TSH nml.   EGD 07/11/22:  Path: 1. Surgical [P], duodenal bulb, 2nd portion of duodenum and distal duodenum - DUODENAL MUCOSA WITH NO SPECIFIC HISTOPATHOLOGIC CHANGES - NEGATIVE FOR INCREASED INTRAEPITHELIAL LYMPHOCYTES OR VILLOUS ARCHITECTURAL CHANGES 2. Surgical [P], gastric antrum and gastric body - GASTRIC ANTRAL AND OXYNTIC MUCOSA WITH HELICOBACTER PYLORI-ASSOCIATED GASTRITIS -  HELICOBACTER PYLORI-LIKE ORGANISMS WERE IDENTIFIED ON ROUTINE H&E STAIN 3. Surgical [P], mid esophagus and distal esophagus - ESOPHAGEAL SQUAMOUS AND CARDIAC MUCOSA WITH FOCALLY ACTIVE CHRONIC NONSPECIFIC CARDIO ESOPHAGITIS - NEGATIVE FOR INCREASED INTRAEPITHELIAL EOSINOPHILS - NEGATIVE FOR INTESTINAL METAPLASIA OR DYSPLASIA  ASSESSMENT AND PLAN: History of H pylori infection N&V - resolved Early satiety - resolved GERD Epigastric ab pain - resolved Patient presents for follow up of H pylori infection. She completed bismuth quadruple therapy 1.5 months ago. She is doing well overall. Will check to see if she has fully eradicated her H pylori infection. Her GERD seems well controlled on PPI QD - Previously gave GERD handout. Counseled again on foods that she should be avoiding - Cont pantoprazole 40 mg QD - Check Diatherix H pylori stool antigen   Christia Reading, MD  I spent 37 minutes of time, including in depth chart review, independent review of results as outlined above, communicating results with the patient directly, face-to-face time with the patient, coordinating care, ordering studies and medications as appropriate, and documentation.

## 2022-10-06 NOTE — Patient Instructions (Addendum)
_______________________________________________________  If your blood pressure at your visit was 140/90 or greater, please contact your primary care physician to follow up on this.  _______________________________________________________  If you are age 59 or older, your body mass index should be between 23-30. Your Body mass index is 29.71 kg/m. If this is out of the aforementioned range listed, please consider follow up with your Primary Care Provider.  If you are age 24 or younger, your body mass index should be between 19-25. Your Body mass index is 29.71 kg/m. If this is out of the aformentioned range listed, please consider follow up with your Primary Care Provider.   ________________________________________________________  The  GI providers would like to encourage you to use [facility] to communicate with providers for non-urgent requests or questions.  Due to long hold times on the telephone, sending your provider a message by [facility] may be a faster and more efficient way to get a response.  Please allow 48 business hours for a response.  Please remember that this is for non-urgent requests.  _______________________________________________________  We have sent the following medications to your pharmacy for you to pick up at your convenience: Omeprazole   Your provider has ordered "Diatherix" stool testing for you. You have received a kit from our office today containing all necessary supplies to complete this test. Please carefully read the stool collection instructions provided in the kit before opening the accompanying materials. In addition, be sure to place the label from the top left corner of the laboratory request sheet onto the "puritan opti-swab" tube that is supplied in the kit. This label should include your full name and date of birth. After completing the test, you should secure the purtian tube into the specimen biohazard bag. The laboratory request information sheet  (including date and time of specimen collection) should be placed into the outside pocket of the specimen biohazard bag and returned to the [facility] with 2 days of collection.

## 2022-10-30 ENCOUNTER — Other Ambulatory Visit: Payer: Self-pay

## 2022-10-30 ENCOUNTER — Telehealth: Payer: Self-pay | Admitting: Family Medicine

## 2022-10-30 DIAGNOSIS — F32A Depression, unspecified: Secondary | ICD-10-CM

## 2022-10-30 MED ORDER — DULOXETINE HCL 20 MG PO CPEP: 20.0000 mg | ORAL_CAPSULE | Freq: Every day | ORAL | 1 refills | Status: DC

## 2022-10-30 NOTE — Telephone Encounter (Signed)
Refill sent in

## 2022-10-30 NOTE — Telephone Encounter (Signed)
Encourage patient to contact the pharmacy for refills or they can request refills through [facility]  (Please schedule appointment if patient has not been seen in over a year)  Last ov was 07/27/22  WHAT PHARMACY WOULD THEY LIKE THIS SENT TO:  [facility] NAME & DOSE:DULoxetine (CYMBALTA) 20 MG capsule   NOTES/COMMENTS FROM PATIENT: patient need refill on her medication.       [place] office please notify patient: It takes 48-72 hours to process rx refill requests Ask patient to call pharmacy to ensure rx is ready before heading there.

## 2022-11-20 ENCOUNTER — Encounter: Payer: BC Managed Care – PPO | Admitting: Family Medicine

## 2022-12-25 ENCOUNTER — Encounter: Payer: Self-pay | Admitting: Family Medicine

## 2022-12-25 ENCOUNTER — Other Ambulatory Visit: Payer: Self-pay | Admitting: Family Medicine

## 2022-12-25 ENCOUNTER — Ambulatory Visit (INDEPENDENT_AMBULATORY_CARE_PROVIDER_SITE_OTHER): Payer: 59 | Admitting: Family Medicine

## 2022-12-25 VITALS — BP 130/80 | HR 75 | Temp 98.1°F | Resp 16 | Ht 60.0 in | Wt 145.0 lb

## 2022-12-25 DIAGNOSIS — Z1231 Encounter for screening mammogram for malignant neoplasm of breast: Secondary | ICD-10-CM

## 2022-12-25 DIAGNOSIS — E559 Vitamin D deficiency, unspecified: Secondary | ICD-10-CM | POA: Diagnosis not present

## 2022-12-25 DIAGNOSIS — I10 Essential (primary) hypertension: Secondary | ICD-10-CM | POA: Diagnosis not present

## 2022-12-25 DIAGNOSIS — K2990 Gastroduodenitis, unspecified, without bleeding: Secondary | ICD-10-CM

## 2022-12-25 DIAGNOSIS — F32A Depression, unspecified: Secondary | ICD-10-CM

## 2022-12-25 DIAGNOSIS — K2970 Gastritis, unspecified, without bleeding: Secondary | ICD-10-CM

## 2022-12-25 DIAGNOSIS — Z0000 Encounter for general adult medical examination without abnormal findings: Secondary | ICD-10-CM | POA: Diagnosis not present

## 2022-12-25 DIAGNOSIS — R112 Nausea with vomiting, unspecified: Secondary | ICD-10-CM

## 2022-12-25 LAB — BASIC METABOLIC PANEL
BUN: 10 mg/dL (ref 6–23)
CO2: 28 mEq/L (ref 19–32)
Calcium: 9.3 mg/dL (ref 8.4–10.5)
Chloride: 103 mEq/L (ref 96–112)
Creatinine, Ser: 0.55 mg/dL (ref 0.40–1.20)
GFR: 100.4 mL/min (ref >60.00)
Glucose, Bld: 80 mg/dL (ref 70–99)
Potassium: 3.6 mEq/L (ref 3.5–5.1)
Sodium: 140 mEq/L (ref 135–145)

## 2022-12-25 LAB — CBC WITH DIFFERENTIAL/PLATELET
Basophils Absolute: 0 10*3/uL (ref 0.0–0.1)
Basophils Relative: 0.5 % (ref 0.0–3.0)
Eosinophils Absolute: 0.1 10*3/uL (ref 0.0–0.7)
Eosinophils Relative: 1.5 % (ref 0.0–5.0)
HCT: 44.2 % (ref 36.0–46.0)
Hemoglobin: 15.2 g/dL — ABNORMAL HIGH (ref 12.0–15.0)
Lymphocytes Relative: 33 % (ref 12.0–46.0)
Lymphs Abs: 2.3 10*3/uL (ref 0.7–4.0)
MCHC: 34.3 g/dL (ref 30.0–36.0)
MCV: 89.3 fl (ref 78.0–100.0)
Monocytes Absolute: 0.5 10*3/uL (ref 0.1–1.0)
Monocytes Relative: 6.5 % (ref 3.0–12.0)
Neutro Abs: 4.1 10*3/uL (ref 1.4–7.7)
Neutrophils Relative %: 58.5 % (ref 43.0–77.0)
Platelets: 210 10*3/uL (ref 150.0–400.0)
RBC: 4.95 Mil/uL (ref 3.87–5.11)
RDW: 13.5 % (ref 11.5–15.5)
WBC: 7 10*3/uL (ref 4.0–10.5)

## 2022-12-25 LAB — VITAMIN D 25 HYDROXY (VIT D DEFICIENCY, FRACTURES): VITD: 29.43 ng/mL — ABNORMAL LOW (ref 30.00–100.00)

## 2022-12-25 LAB — LIPID PANEL
Cholesterol: 196 mg/dL (ref 0–200)
HDL: 59.9 mg/dL (ref >39.00)
LDL Cholesterol: 111 mg/dL — ABNORMAL HIGH (ref 0–99)
NonHDL: 136.49
Total CHOL/HDL Ratio: 3
Triglycerides: 129 mg/dL (ref 0.0–149.0)
VLDL: 25.8 mg/dL (ref 0.0–40.0)

## 2022-12-25 LAB — HEPATIC FUNCTION PANEL
ALT: 11 U/L (ref 0–35)
AST: 15 U/L (ref 0–37)
Albumin: 4.2 g/dL (ref 3.5–5.2)
Alkaline Phosphatase: 79 U/L (ref 39–117)
Bilirubin, Direct: 0.1 mg/dL (ref 0.0–0.3)
Total Bilirubin: 0.5 mg/dL (ref 0.2–1.2)
Total Protein: 6.5 g/dL (ref 6.0–8.3)

## 2022-12-25 LAB — TSH: TSH: 1.75 u[IU]/mL (ref 0.35–5.50)

## 2022-12-25 MED ORDER — AMLODIPINE BESYLATE 10 MG PO TABS
10.0000 mg | ORAL_TABLET | Freq: Every day | ORAL | 1 refills | Status: AC
Start: 2022-12-25 — End: 2023-06-23

## 2022-12-25 MED ORDER — OMEPRAZOLE 40 MG PO CPDR
40.0000 mg | DELAYED_RELEASE_CAPSULE | Freq: Every day | ORAL | 1 refills | Status: DC
Start: 2022-12-25 — End: 2022-12-25

## 2022-12-25 MED ORDER — VALSARTAN-HYDROCHLOROTHIAZIDE 80-12.5 MG PO TABS: 1.0000 | ORAL_TABLET | Freq: Every day | ORAL | 1 refills | Status: DC

## 2022-12-25 MED ORDER — DULOXETINE HCL 20 MG PO CPEP: 20.0000 mg | ORAL_CAPSULE | Freq: Every day | ORAL | 1 refills | Status: DC

## 2022-12-25 NOTE — Assessment & Plan Note (Signed)
Chronic problem.  Currently well controlled.  Asymptomatic.  Check labs.  No anticipated med changes. 

## 2022-12-25 NOTE — Assessment & Plan Note (Signed)
Pt's PE WNL.  UTD on pap.  Due for mammo- ordered.  UTD on cologuard.  Check labs.  Anticipatory guidance provided.

## 2022-12-25 NOTE — Patient Instructions (Signed)
Follow up in 6 months to recheck blood pressure We'll notify you of your lab results and make any changes if needed They will call you to schedule your mammogram Try and quit smoking- you can do it! Continue to work on healthy diet and regular exercise Call with any questions or concerns Stay Safe!  Stay Healthy!

## 2022-12-25 NOTE — Progress Notes (Signed)
   Subjective:    Patient ID: Patricia Kennedy, female    DOB: [birth year], 58 y.o.   MRN: [id number]  HPI CPE- due for mammo, colonoscopy- did cologuard.  Pt had pap 09/22/19.  Reports she did cologuard w/ GI  Patient Care Team    Relationship Specialty Notifications Start End  Sheliah Hatch, MD PCP - General Family Medicine  09/01/20     Health Maintenance  Topic Date Due   PAP SMEAR-Modifier  09/21/2022   COLONOSCOPY (Pts 45-40yrs Insurance coverage will need to be confirmed)  01/20/2023 (Originally 08/30/2008)   MAMMOGRAM  07/22/2023 (Originally 09/16/2021)   DTaP/Tdap/Td (2 - Td or Tdap) 02/20/2023   INFLUENZA VACCINE  03/22/2023   Hepatitis C Screening  Completed   HIV Screening  Completed   HPV VACCINES  Aged Out   COVID-19 Vaccine  Discontinued   Zoster Vaccines- Shingrix  Discontinued      Review of Systems Patient reports no vision/ hearing changes, adenopathy,fever, weight change,  persistant/recurrent hoarseness , swallowing issues, chest pain, palpitations, edema, persistant/recurrent cough, hemoptysis, dyspnea (rest/exertional/paroxysmal nocturnal), gastrointestinal bleeding (melena, rectal bleeding), abdominal pain, significant heartburn, bowel changes, GU symptoms (dysuria, hematuria, incontinence), Gyn symptoms (abnormal  bleeding, pain),  syncope, focal weakness, memory loss, numbness & tingling, skin/hair/nail changes, abnormal bruising or bleeding, anxiety, or depression.     Objective:   Physical Exam General Appearance:    Alert, cooperative, no distress, appears stated age  Head:    Normocephalic, without obvious abnormality, atraumatic  Eyes:    PERRL, conjunctiva/corneas clear, EOM's intact both eyes  Ears:    Normal TM's and external ear canals, both ears  Nose:   Nares normal, septum midline, mucosa normal, no drainage    or sinus tenderness  Throat:   Lips, mucosa, and tongue normal; teeth and gums normal  Neck:   Supple, symmetrical, trachea midline, no  adenopathy;    Thyroid: no enlargement/tenderness/nodules  Back:     Symmetric, no curvature, ROM normal, no CVA tenderness  Lungs:     Clear to auscultation bilaterally, respirations unlabored  Chest Wall:    No tenderness or deformity   Heart:    Regular rate and rhythm, S1 and S2 normal, no murmur, rub   or gallop  Breast Exam:    Deferred to GYN  Abdomen:     Soft, non-tender, bowel sounds active all four quadrants,    no masses, no organomegaly  Genitalia:    Deferred to GYN  Rectal:    Extremities:   Extremities normal, atraumatic, no cyanosis or edema  Pulses:   2+ and symmetric all extremities  Skin:   Skin color, texture, turgor normal, no rashes or lesions  Lymph nodes:   Cervical, supraclavicular, and axillary nodes normal  Neurologic:   CNII-XII intact, normal strength, sensation and reflexes    throughout          Assessment & Plan:

## 2022-12-26 ENCOUNTER — Telehealth: Payer: Self-pay

## 2022-12-26 NOTE — Telephone Encounter (Signed)
Left results on pt VM  

## 2022-12-26 NOTE — Telephone Encounter (Signed)
-----   Message from Sheliah Hatch, MD sent at 12/26/2022  7:13 AM EDT ----- Labs look good!  Vit D is a little low so please make sure you are taking a daily OTC supplement of at least 2000 units

## 2023-01-22 ENCOUNTER — Encounter (HOSPITAL_BASED_OUTPATIENT_CLINIC_OR_DEPARTMENT_OTHER): Payer: Self-pay

## 2023-01-22 ENCOUNTER — Ambulatory Visit (HOSPITAL_BASED_OUTPATIENT_CLINIC_OR_DEPARTMENT_OTHER)
Admission: RE | Admit: 2023-01-22 | Discharge: 2023-01-22 | Disposition: A | Discharge status: Home / Self Care | Payer: 59 | Source: Ambulatory Visit | Attending: Family Medicine | Admitting: Family Medicine

## 2023-01-22 DIAGNOSIS — Z1231 Encounter for screening mammogram for malignant neoplasm of breast: Secondary | ICD-10-CM

## 2023-05-21 ENCOUNTER — Ambulatory Visit: Payer: 59 | Admitting: Family Medicine

## 2023-05-21 ENCOUNTER — Encounter: Payer: Self-pay | Admitting: Family Medicine

## 2023-05-21 VITALS — BP 118/70 | HR 98 | Temp 98.5°F | Wt 154.0 lb

## 2023-05-21 DIAGNOSIS — R21 Rash and other nonspecific skin eruption: Secondary | ICD-10-CM | POA: Diagnosis not present

## 2023-05-21 DIAGNOSIS — R5383 Other fatigue: Secondary | ICD-10-CM | POA: Diagnosis not present

## 2023-05-21 DIAGNOSIS — F32A Depression, unspecified: Secondary | ICD-10-CM | POA: Diagnosis not present

## 2023-05-21 DIAGNOSIS — R112 Nausea with vomiting, unspecified: Secondary | ICD-10-CM

## 2023-05-21 DIAGNOSIS — K2990 Gastroduodenitis, unspecified, without bleeding: Secondary | ICD-10-CM

## 2023-05-21 DIAGNOSIS — K2970 Gastritis, unspecified, without bleeding: Secondary | ICD-10-CM | POA: Diagnosis not present

## 2023-05-21 DIAGNOSIS — I10 Essential (primary) hypertension: Secondary | ICD-10-CM | POA: Diagnosis not present

## 2023-05-21 LAB — CBC WITH DIFFERENTIAL/PLATELET
Basophils Absolute: 0 10*3/uL (ref 0.0–0.1)
Basophils Relative: 0.8 % (ref 0.0–3.0)
Eosinophils Absolute: 0.1 10*3/uL (ref 0.0–0.7)
Eosinophils Relative: 1.3 % (ref 0.0–5.0)
HCT: 42.6 % (ref 36.0–46.0)
Hemoglobin: 14.3 g/dL (ref 12.0–15.0)
Lymphocytes Relative: 30.2 % (ref 12.0–46.0)
Lymphs Abs: 1.9 10*3/uL (ref 0.7–4.0)
MCHC: 33.6 g/dL (ref 30.0–36.0)
MCV: 91.7 fL (ref 78.0–100.0)
Monocytes Absolute: 0.5 10*3/uL (ref 0.1–1.0)
Monocytes Relative: 7.8 % (ref 3.0–12.0)
Neutro Abs: 3.8 10*3/uL (ref 1.4–7.7)
Neutrophils Relative %: 59.9 % (ref 43.0–77.0)
Platelets: 230 10*3/uL (ref 150.0–400.0)
RBC: 4.65 Mil/uL (ref 3.87–5.11)
RDW: 13 % (ref 11.5–15.5)
WBC: 6.3 10*3/uL (ref 4.0–10.5)

## 2023-05-21 LAB — HEPATIC FUNCTION PANEL
ALT: 12 U/L (ref 0–35)
AST: 14 U/L (ref 0–37)
Albumin: 4.2 g/dL (ref 3.5–5.2)
Alkaline Phosphatase: 85 U/L (ref 39–117)
Bilirubin, Direct: 0 mg/dL (ref 0.0–0.3)
Total Bilirubin: 0.4 mg/dL (ref 0.2–1.2)
Total Protein: 6.3 g/dL (ref 6.0–8.3)

## 2023-05-21 LAB — B12 AND FOLATE PANEL: Folate: 9.3 ng/mL (ref >5.9)

## 2023-05-21 LAB — BASIC METABOLIC PANEL
BUN: 11 mg/dL (ref 6–23)
CO2: 26 meq/L (ref 19–32)
Calcium: 9.2 mg/dL (ref 8.4–10.5)
Chloride: 106 meq/L (ref 96–112)
Creatinine, Ser: 0.51 mg/dL (ref 0.40–1.20)
GFR: 101.96 mL/min (ref >60.00)
Glucose, Bld: 92 mg/dL (ref 70–99)
Potassium: 3.6 meq/L (ref 3.5–5.1)
Sodium: 142 meq/L (ref 135–145)

## 2023-05-21 LAB — TSH: TSH: 1.52 u[IU]/mL (ref 0.35–5.50)

## 2023-05-21 LAB — VITAMIN D 25 HYDROXY (VIT D DEFICIENCY, FRACTURES): VITD: 19.98 ng/mL — ABNORMAL LOW (ref 30.00–100.00)

## 2023-05-21 MED ORDER — TERBINAFINE HCL 250 MG PO TABS: 250.0000 mg | ORAL_TABLET | Freq: Every day | ORAL | 0 refills | Status: AC

## 2023-05-21 MED ORDER — AMLODIPINE BESYLATE 10 MG PO TABS
10.0000 mg | ORAL_TABLET | Freq: Every day | ORAL | 1 refills | Status: DC
Start: 2023-05-21 — End: 2023-09-24

## 2023-05-21 MED ORDER — OMEPRAZOLE 40 MG PO CPDR: 40.0000 mg | DELAYED_RELEASE_CAPSULE | Freq: Every day | ORAL | 1 refills | Status: DC

## 2023-05-21 MED ORDER — CLOTRIMAZOLE-BETAMETHASONE 1-0.05 % EX CREA: 1.0000 | TOPICAL_CREAM | Freq: Two times a day (BID) | CUTANEOUS | 1 refills | Status: DC

## 2023-05-21 MED ORDER — DULOXETINE HCL 20 MG PO CPEP: 20.0000 mg | ORAL_CAPSULE | Freq: Every day | ORAL | 1 refills | Status: DC

## 2023-05-21 MED ORDER — VALSARTAN-HYDROCHLOROTHIAZIDE 80-12.5 MG PO TABS: 1.0000 | ORAL_TABLET | Freq: Every day | ORAL | 1 refills | Status: DC

## 2023-05-21 NOTE — Progress Notes (Signed)
   Subjective:    Patient ID: Patricia Kennedy, female    DOB: 1965-10-21, 57 y.o.   MRN: [id number]  HPI Skin rash- has well demarcated circular lesions on legs.  These appeared ~3 weeks ago.  Was given a cream by employee health (OTC) but 'it's not helping'. Some burning and itching.  No change in soap or detergents.  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  Ends up going home very wet.     Fatigue- pt reports sleeping well at night but finds herself always tired.  Will nap during the day.  No SOB, CP, HA's.  Sxs started '2 weeks ago'.  This has been a recurring problem for pt.  She states no changes to medications.    Review of Systems For ROS see HPI     Objective:   Physical Exam Vitals reviewed.  Constitutional:      General: She is not in acute distress.    Appearance: Normal appearance. She is not ill-appearing.  HENT:     Head: Normocephalic and atraumatic.  Eyes:     General: No scleral icterus.    Extraocular Movements: Extraocular movements intact.     Conjunctiva/sclera: Conjunctivae normal.  Cardiovascular:     Rate and Rhythm: Normal rate and regular rhythm.     Pulses: Normal pulses.  Pulmonary:     Effort: Pulmonary effort is normal. No respiratory distress.     Breath sounds: No wheezing or rhonchi.  Abdominal:     General: There is no distension.     Palpations: Abdomen is soft.     Tenderness: There is no abdominal tenderness.  Musculoskeletal:     Cervical back: Neck supple.  Lymphadenopathy:     Cervical: No cervical adenopathy.  Skin:    General: Skin is warm and dry.     Findings: Rash (annular lesions w/ well demarcated raised borders w/ some scaling on both legs) present.  Neurological:     Mental Status: She is alert.           Assessment & Plan:  Skin rash- new.  First appeared ~3 week ago.  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  Is frequently wet/damp throughout the day.  Areas are consistent w/ tinea  corporis, but given that it is wide spread, will start oral tx (Terbinafine) in addition to topical tx (Lotrisone).  Encouraged her to keep areas clean and dry.  Pt expressed understanding and is in agreement w/ plan.   Fatigue- deteriorated.  This is a recurring problem for pt.  She states this episode started ~2 weeks ago.  States she is sleeping well at night but is 'always tired'.  Wants a 'pill for energy'.  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  Will check labs to r/o metabolic causes or deficiencies.  Encouraged daily multivitamin.  Will follow.

## 2023-05-21 NOTE — Patient Instructions (Signed)
Follow up as needed or as scheduled We'll notify you of your lab results and make any changes if needed START the Terbinafine 1 tab daily to treat your rash USE the cream (Clotrimazole/Triamcinolone) twice daily on the red areas Call with any questions or concerns Hang in there!!

## 2023-05-22 ENCOUNTER — Other Ambulatory Visit: Payer: Self-pay

## 2023-05-22 DIAGNOSIS — E559 Vitamin D deficiency, unspecified: Secondary | ICD-10-CM

## 2023-05-22 MED ORDER — VITAMIN D (ERGOCALCIFEROL) 1.25 MG (50000 UNIT) PO CAPS
50000.0000 [IU] | ORAL_CAPSULE | ORAL | 0 refills | Status: DC
Start: 2023-05-22 — End: 2023-09-24

## 2023-06-07 ENCOUNTER — Ambulatory Visit: Payer: 59 | Admitting: Family Medicine

## 2023-06-07 ENCOUNTER — Encounter: Payer: Self-pay | Admitting: Family Medicine

## 2023-06-07 VITALS — BP 122/72 | HR 89 | Temp 98.7°F | Ht 60.0 in | Wt 156.0 lb

## 2023-06-07 DIAGNOSIS — R051 Acute cough: Secondary | ICD-10-CM

## 2023-06-07 LAB — POC COVID19 BINAXNOW: SARS Coronavirus 2 Ag: NEGATIVE

## 2023-06-07 MED ORDER — AZITHROMYCIN 250 MG PO TABS: ORAL_TABLET | ORAL | 0 refills | Status: AC

## 2023-06-07 MED ORDER — PROMETHAZINE-DM 6.25-15 MG/5ML PO SYRP: 5.0000 mL | ORAL_SOLUTION | Freq: Four times a day (QID) | ORAL | 0 refills | Status: DC | PRN

## 2023-06-07 NOTE — Patient Instructions (Signed)
Follow up as needed or as scheduled Thankfully your COVID test is negative START the Zpack (Azithromycin) as directed USE the cough syrup as needed Drink LOTS of water STOP SMOKING!!! Rest! Call with any questions or concerns Hang in there!

## 2023-06-07 NOTE — Progress Notes (Signed)
   Subjective:    Patient ID: Patricia Kennedy, female    DOB: 1965-12-07, 57 y.o.   MRN: [id number]  HPI Cough- pt reports sxs started Tuesday.  Wednesday was not able to go to work due to HA, nasal congestion.  + facial pain.  Bilateral ear pain.  No fever.  + cough- 'feels like my chest is tearing open'.  Cough is productive.  No known sick contacts.  Denies body aches.   Review of Systems For ROS see HPI     Objective:   Physical Exam Vitals reviewed.  Constitutional:      General: She is not in acute distress.    Appearance: She is not ill-appearing.  HENT:     Head: Normocephalic and atraumatic.     Right Ear: Tympanic membrane and ear canal normal.     Left Ear: Tympanic membrane and ear canal normal.     Nose: Congestion present.     Comments: Mild TTP over frontal and maxillary sinuses Eyes:     Extraocular Movements: Extraocular movements intact.     Conjunctiva/sclera: Conjunctivae normal.  Cardiovascular:     Rate and Rhythm: Normal rate and regular rhythm.  Pulmonary:     Effort: Pulmonary effort is normal. No respiratory distress.     Breath sounds: No wheezing or rhonchi.     Comments: Deep, hacking cough Musculoskeletal:     Cervical back: Neck supple.  Lymphadenopathy:     Cervical: No cervical adenopathy.  Skin:    General: Skin is warm and dry.  Neurological:     General: No focal deficit present.     Mental Status: She is alert and oriented to person, place, and time.  Psychiatric:        Mood and Affect: Mood normal.        Behavior: Behavior normal.           Assessment & Plan:   Cough- pt has hx of severe coughs due to her ongoing smoking.  Given the recent rise of pertussis and atypical mycoplasma infxns will start Zpack.  Cough meds prn.  Again encouraged pt to quit smoking.  Reviewed supportive care and red flags that should prompt return.  Pt expressed understanding and is in agreement w/ plan.

## 2023-06-25 ENCOUNTER — Ambulatory Visit: Payer: 59 | Admitting: Family Medicine

## 2023-06-25 ENCOUNTER — Encounter: Payer: Self-pay | Admitting: Family Medicine

## 2023-09-24 ENCOUNTER — Encounter: Payer: Self-pay | Admitting: Family Medicine

## 2023-09-24 ENCOUNTER — Ambulatory Visit: Payer: Self-pay | Admitting: Family Medicine

## 2023-09-24 ENCOUNTER — Ambulatory Visit: Payer: PRIVATE HEALTH INSURANCE | Admitting: Family Medicine

## 2023-09-24 VITALS — BP 100/78 | HR 78 | Temp 98.8°F | Ht 61.0 in | Wt 149.5 lb

## 2023-09-24 DIAGNOSIS — F32A Depression, unspecified: Secondary | ICD-10-CM | POA: Diagnosis not present

## 2023-09-24 DIAGNOSIS — E785 Hyperlipidemia, unspecified: Secondary | ICD-10-CM

## 2023-09-24 DIAGNOSIS — I10 Essential (primary) hypertension: Secondary | ICD-10-CM | POA: Diagnosis not present

## 2023-09-24 DIAGNOSIS — B369 Superficial mycosis, unspecified: Secondary | ICD-10-CM | POA: Diagnosis not present

## 2023-09-24 MED ORDER — VALSARTAN-HYDROCHLOROTHIAZIDE 80-12.5 MG PO TABS: 1.0000 | ORAL_TABLET | Freq: Every day | ORAL | 1 refills | Status: DC

## 2023-09-24 MED ORDER — DULOXETINE HCL 20 MG PO CPEP: 20.0000 mg | ORAL_CAPSULE | Freq: Every day | ORAL | 1 refills | Status: DC

## 2023-09-24 MED ORDER — NYSTATIN 100000 UNIT/GM EX POWD: 1.0000 | Freq: Three times a day (TID) | CUTANEOUS | 0 refills | Status: AC

## 2023-09-24 MED ORDER — AMLODIPINE BESYLATE 10 MG PO TABS
10.0000 mg | ORAL_TABLET | Freq: Every day | ORAL | 1 refills | Status: DC
Start: 2023-09-24 — End: 2023-12-04

## 2023-09-24 NOTE — Progress Notes (Unsigned)
   Subjective:    Patient ID: Patricia Kennedy, female    DOB: February 07, 1966, 58 y.o.   MRN: [id number]  HPI HTN- chronic problem, on Amlodipine 10mg  daily and Valsartan hydrochlorothiazide 80/12.5mg  daily w/ good control.  Denies CP, SOB.  + chronic cough- sees pulmonary this afternoon.  Overweight- pt is down 7 lbs since October.  BMI now 28.25  Hyperlipidemia- ongoing issue.  She was previously on Lipitor 20mg  daily.  Not currently on medication.  Denies abd pain, N/V.  Fungal dermatitis- under L breast, has burning pain.  Review of Systems For ROS see HPI     Objective:   Physical Exam        Assessment & Plan:

## 2023-09-24 NOTE — Telephone Encounter (Signed)
This RN made first attempt to triage. No answer, LVM. Will route to call back folder for more attempts.   Copied from CRM [id number]. Topic: Clinical - Medical Advice >> Sep 24, 2023  4:22 PM Patricia Kennedy wrote: Reason for CRM: Patient states she was seen today in office and went to another appt for pulmonology and her blood pressure went down to 80/55 and patient would like to know why it wen down and if there is anything the provider suggest. Pulmonary told her to reach out to primary. Thank You

## 2023-09-24 NOTE — Patient Instructions (Signed)
Schedule your complete physical in 6 months We'll notify you of your lab results and make any changes if needed USE the powder under your L breast to help with the burning During Ramadan, take HALF of the Valsartan/Hydrochlorothiazide daily Make sure you are drinking LOTS of water during the day Call with any questions or concerns Happy [birth year] Birthday!

## 2023-09-24 NOTE — Telephone Encounter (Signed)
This RN made 3rd attempt to reach pt, LMOM to return call to office. Routing to provider office.

## 2023-09-25 ENCOUNTER — Ambulatory Visit: Payer: Self-pay | Admitting: Family Medicine

## 2023-09-25 LAB — LIPID PANEL
Cholesterol: 223 mg/dL — ABNORMAL HIGH (ref 0–200)
HDL: 66.6 mg/dL (ref >39.00)
LDL Cholesterol: 122 mg/dL — ABNORMAL HIGH (ref 0–99)
NonHDL: 156.22
Total CHOL/HDL Ratio: 3
Triglycerides: 170 mg/dL — ABNORMAL HIGH (ref 0.0–149.0)
VLDL: 34 mg/dL (ref 0.0–40.0)

## 2023-09-25 LAB — HEPATIC FUNCTION PANEL
ALT: 15 U/L (ref 0–35)
AST: 20 U/L (ref 0–37)
Albumin: 4.6 g/dL (ref 3.5–5.2)
Alkaline Phosphatase: 86 U/L (ref 39–117)
Bilirubin, Direct: 0.1 mg/dL (ref 0.0–0.3)
Total Bilirubin: 0.4 mg/dL (ref 0.2–1.2)
Total Protein: 7.1 g/dL (ref 6.0–8.3)

## 2023-09-25 LAB — TSH: TSH: 1.59 u[IU]/mL (ref 0.35–5.50)

## 2023-09-25 LAB — CBC WITH DIFFERENTIAL/PLATELET
Basophils Absolute: 0.1 10*3/uL (ref 0.0–0.1)
Basophils Relative: 0.8 % (ref 0.0–3.0)
Eosinophils Absolute: 0.1 10*3/uL (ref 0.0–0.7)
Eosinophils Relative: 1.3 % (ref 0.0–5.0)
HCT: 45.7 % (ref 36.0–46.0)
Hemoglobin: 15.7 g/dL — ABNORMAL HIGH (ref 12.0–15.0)
Lymphocytes Relative: 30.9 % (ref 12.0–46.0)
Lymphs Abs: 2.1 10*3/uL (ref 0.7–4.0)
MCHC: 34.3 g/dL (ref 30.0–36.0)
MCV: 91.8 fL (ref 78.0–100.0)
Monocytes Absolute: 0.5 10*3/uL (ref 0.1–1.0)
Monocytes Relative: 7.1 % (ref 3.0–12.0)
Neutro Abs: 4 10*3/uL (ref 1.4–7.7)
Neutrophils Relative %: 59.9 % (ref 43.0–77.0)
Platelets: 241 10*3/uL (ref 150.0–400.0)
RBC: 4.97 Mil/uL (ref 3.87–5.11)
RDW: 13 % (ref 11.5–15.5)
WBC: 6.7 10*3/uL (ref 4.0–10.5)

## 2023-09-25 LAB — BASIC METABOLIC PANEL
BUN: 11 mg/dL (ref 6–23)
CO2: 28 meq/L (ref 19–32)
Calcium: 9.6 mg/dL (ref 8.4–10.5)
Chloride: 102 meq/L (ref 96–112)
Creatinine, Ser: 0.56 mg/dL (ref 0.40–1.20)
GFR: 99.44 mL/min (ref >60.00)
Glucose, Bld: 62 mg/dL — ABNORMAL LOW (ref 70–99)
Potassium: 3.9 meq/L (ref 3.5–5.1)
Sodium: 140 meq/L (ref 135–145)

## 2023-09-25 NOTE — Telephone Encounter (Signed)
Pt has been notified  See note 09/24/2023 Pt has appt 10/04/2023

## 2023-09-25 NOTE — Telephone Encounter (Signed)
 Left vm to call office

## 2023-09-25 NOTE — Telephone Encounter (Signed)
  Chief Complaint: Elevated BP Symptoms: Mild HA Frequency: BP variable this week Pertinent Negatives: Patient denies CP, SOB, vision changes Disposition: [] ED /[] Urgent Care (no appt availability in office) / [x] Appointment(In office/virtual)/ []  [facility]/ [] Home Care/ [] Refused Recommended Disposition /[] [facility]/ []  Follow-up with PCP Additional Notes: Pt's sister reports pt had low BP readings 80s/50s yesterday follow specialist appt. Pt's BP this AM was 150/80, pt has been taking BP medications as prescribed. She reports pt notes mild HA but denies CP, SOB, vision changes, numbness or weakness. OV scheduled 02/10 with PC for possible medication adjustments if appropriate. This RN educated pt on home care, new-worsening symptoms, when to call back/seek emergent care. Pt verbalized understanding and agrees to plan.     Copied from CRM [id number]. Topic: Clinical - Medical Advice >> Sep 25, 2023  1:39 PM Victoria A wrote: Reason for CRM: Patients sister blood pressure is high 150/80; patient has headache Reason for Disposition  [1] Systolic BP  >= 130 OR Diastolic >= 80 AND [2] taking BP medications  Answer Assessment - Initial Assessment Questions 1. BLOOD PRESSURE: What is the blood pressure? Did you take at least two measurements 5 minutes apart?     150/80 2. ONSET: When did you take your blood pressure?     This AM 3. HOW: How did you take your blood pressure? (e.g., automatic home BP monitor, visiting nurse)     Home wrist cuff 4. HISTORY: Do you have a history of high blood pressure?     Yes 5. MEDICINES: Are you taking any medicines for blood pressure? Have you missed any doses recently?     Yes, dose taken today 6. OTHER SYMPTOMS: Do you have any symptoms? (e.g., blurred vision, chest pain, difficulty breathing, headache, weakness)     HA  Protocols used: Blood Pressure - High-A-AH

## 2023-09-25 NOTE — Telephone Encounter (Signed)
Noted.  Suspect cuff may not be accurate.  I encourage her to bring whatever blood pressure cuff she uses at home to her appt so we can check the accuracy

## 2023-09-25 NOTE — Telephone Encounter (Signed)
 I don't see the most recent pulmonary note but the one from January had her BP at 120/86.  Her blood pressure will drop if she is not drinking enough water.  If she would like, we can decrease her Amlodipine  to 5mg  daily and she can come back in 3-4 weeks to make sure BP is still in normal range.

## 2023-09-25 NOTE — Telephone Encounter (Signed)
Called pt to offer sooner appt or to be seen w/ another provider. She denied. She is scheduled for 10/04/2023  Per note 09/24/2023 her B/P was low and now she states this is high. She did not want to change any medications until she talks with PCP

## 2023-09-26 ENCOUNTER — Telehealth: Payer: Self-pay

## 2023-09-26 NOTE — Telephone Encounter (Signed)
 Patient has been informed and will bring it in on 10/04/23

## 2023-09-26 NOTE — Telephone Encounter (Signed)
 Pt has been notified.

## 2023-09-26 NOTE — Telephone Encounter (Signed)
-----   Message from Laymon Priest sent at 09/25/2023  6:46 PM EST ----- Labs are stable.  No changes at this time

## 2023-09-30 NOTE — Assessment & Plan Note (Signed)
 Chronic problem.  Was previously on Lipitor 20mg  daily but stopped this.  Currently asymptomatic.  Check labs and restart meds prn.

## 2023-09-30 NOTE — Assessment & Plan Note (Signed)
 Chronic problem.  Stable.  Refill provided on Cymbalta 

## 2023-09-30 NOTE — Assessment & Plan Note (Signed)
 Chronic problem.  Well controlled.  Currently asymptomatic w/ exception of cough- sees pulmonary later today.  No med changes at this time but during Ramadan when she is not able to drink fluids during the day will decrease Valsartan  hydrochlorothiazide  to 1/2 tab daily.  Pt expressed understanding and is in agreement w/ plan.

## 2023-10-01 ENCOUNTER — Ambulatory Visit: Payer: PRIVATE HEALTH INSURANCE | Admitting: Family Medicine

## 2023-10-04 ENCOUNTER — Ambulatory Visit: Payer: PRIVATE HEALTH INSURANCE | Admitting: Family Medicine

## 2023-12-04 ENCOUNTER — Other Ambulatory Visit: Payer: Self-pay | Admitting: Family Medicine

## 2023-12-04 DIAGNOSIS — F32A Depression, unspecified: Secondary | ICD-10-CM

## 2023-12-04 DIAGNOSIS — I10 Essential (primary) hypertension: Secondary | ICD-10-CM

## 2023-12-04 MED ORDER — DULOXETINE HCL 20 MG PO CPEP
20.0000 mg | ORAL_CAPSULE | Freq: Every day | ORAL | 1 refills | Status: AC
Start: 2023-12-04 — End: ?

## 2023-12-04 MED ORDER — AMLODIPINE BESYLATE 10 MG PO TABS
10.0000 mg | ORAL_TABLET | Freq: Every day | ORAL | 1 refills | Status: DC
Start: 2023-12-04 — End: 2024-04-08

## 2023-12-04 MED ORDER — VALSARTAN-HYDROCHLOROTHIAZIDE 80-12.5 MG PO TABS
1.0000 | ORAL_TABLET | Freq: Every day | ORAL | 1 refills | Status: DC
Start: 2023-12-04 — End: 2024-04-08

## 2023-12-04 NOTE — Telephone Encounter (Signed)
 Copied from CRM [id number]. Topic: Clinical - Medication Refill >> Dec 04, 2023 10:38 AM Albertha Alosa wrote: Most Recent Primary Care Visit:  Provider: TABORI, KATHERINE E  Department: [facility]  Visit Type: OFFICE VISIT  Date: 09/24/2023  Medication:  valsartan-hydrochlorothiazide (DIOVAN-HCT) 80-12.5 MG tablet amLODipine (NORVASC) 10 MG tablet  DULoxetine (CYMBALTA) 20 MG capsule  Has the patient contacted their pharmacy? Yes (Agent: If no, request that the patient contact the pharmacy for the refill. If patient does not wish to contact the pharmacy document the reason why and proceed with request.) (Agent: If yes, when and what did the pharmacy advise?)  Is this the correct pharmacy for this prescription? Yes If no, delete pharmacy and type the correct one.  This is the patient's preferred pharmacy:   [facility] - [address] [address], [facility] [address] [place] [address] Phone: [phone] Fax: [phone]   Has the prescription been filled recently? No  Is the patient out of the medication? Yes  Has the patient been seen for an appointment in the last year OR does the patient have an upcoming appointment? Yes  Can we respond through MyChart? Yes  Agent: Please be advised that Rx refills may take up to 3 business days. We ask that you follow-up with your pharmacy.

## 2023-12-28 ENCOUNTER — Encounter: Payer: Self-pay | Admitting: Family Medicine

## 2023-12-28 ENCOUNTER — Ambulatory Visit: Payer: PRIVATE HEALTH INSURANCE | Admitting: Family Medicine

## 2023-12-28 VITALS — BP 108/68 | HR 68 | Temp 98.8°F | Ht 61.0 in | Wt 148.3 lb

## 2023-12-28 DIAGNOSIS — R930 Abnormal findings on diagnostic imaging of skull and head, not elsewhere classified: Secondary | ICD-10-CM | POA: Diagnosis not present

## 2023-12-28 NOTE — Progress Notes (Unsigned)
   Subjective:    Patient ID: Patricia Kennedy, female    DOB: [birth year], 58 y.o.   MRN: [id number]  HPI Abnormal xray- pt went to dentist for routine appt and dentist took xrays.  She was told there is an abnormality in L sinus.  Pt was told she needed to f/u.   Review of Systems For ROS see HPI     Objective:   Physical Exam        Assessment & Plan:

## 2023-12-28 NOTE — Patient Instructions (Signed)
 Follow up as needed or as scheduled We'll call you to schedule your ENT appt Make sure you take the picture of your xray to your appt Call with any questions or concerns Stay Safe!  Stay Healthy! Hang in there!!!

## 2024-01-01 ENCOUNTER — Encounter: Payer: Self-pay | Admitting: Family Medicine

## 2024-03-08 LAB — COLOGUARD: COLOGUARD: NEGATIVE

## 2024-04-08 ENCOUNTER — Other Ambulatory Visit: Payer: Self-pay | Admitting: Family Medicine

## 2024-04-08 DIAGNOSIS — I10 Essential (primary) hypertension: Secondary | ICD-10-CM
